# Patient Record
Sex: Male | Born: 1985 | Race: Black or African American | Hispanic: No | Marital: Single | Smoking: Never smoker
Health system: Southern US, Community
[De-identification: ages and names within clinical notes are randomized; demographics above are authoritative.]

## PROBLEM LIST (undated history)

## (undated) DIAGNOSIS — J45909 Unspecified asthma, uncomplicated: Secondary | ICD-10-CM

## (undated) HISTORY — PX: LUNG SURGERY: SHX703

---

## 2014-11-01 DIAGNOSIS — L298 Other pruritus: Secondary | ICD-10-CM | POA: Insufficient documentation

## 2017-10-06 ENCOUNTER — Other Ambulatory Visit (HOSPITAL_COMMUNITY)
Admission: RE | Admit: 2017-10-06 | Discharge: 2017-10-06 | Disposition: A | Discharge status: Home / Self Care | Payer: Managed Care, Other (non HMO) | Source: Ambulatory Visit | Attending: Family Medicine | Admitting: Family Medicine

## 2017-10-06 ENCOUNTER — Other Ambulatory Visit: Payer: Self-pay | Admitting: Family Medicine

## 2017-10-06 DIAGNOSIS — Z113 Encounter for screening for infections with a predominantly sexual mode of transmission: Secondary | ICD-10-CM | POA: Insufficient documentation

## 2017-10-13 LAB — URINE CYTOLOGY ANCILLARY ONLY
BACTERIAL VAGINITIS: NEGATIVE
CANDIDA VAGINITIS: POSITIVE — AB
Chlamydia: NEGATIVE
NEISSERIA GONORRHEA: NEGATIVE
Trichomonas: NEGATIVE

## 2017-10-28 ENCOUNTER — Other Ambulatory Visit: Payer: Self-pay

## 2017-10-28 ENCOUNTER — Ambulatory Visit (HOSPITAL_COMMUNITY)
Admission: EM | Admit: 2017-10-28 | Discharge: 2017-10-28 | Disposition: A | Discharge status: Home / Self Care | Payer: Managed Care, Other (non HMO) | Attending: Family Medicine | Admitting: Family Medicine

## 2017-10-28 ENCOUNTER — Encounter (HOSPITAL_COMMUNITY): Payer: Self-pay | Admitting: Emergency Medicine

## 2017-10-28 DIAGNOSIS — J069 Acute upper respiratory infection, unspecified: Secondary | ICD-10-CM

## 2017-10-28 DIAGNOSIS — H6122 Impacted cerumen, left ear: Secondary | ICD-10-CM | POA: Diagnosis not present

## 2017-10-28 HISTORY — DX: Unspecified asthma, uncomplicated: J45.909

## 2017-10-28 NOTE — ED Provider Notes (Signed)
[facility]    CSN: [id number] Arrival date & time: 10/28/17  1407     History   Chief Complaint Chief Complaint  Patient presents with  . URI    HPI Jonathon Atkinson is a 31 y.o. male.   31 year old male complaining of cough, occasional chills, sneezing, nasal congestion. Denies fever. All medicine Mucinex. Also states his left ear is clogged.      Past Medical History:  Diagnosis Date  . Asthma     There are no active problems to display for this patient.   History reviewed. No pertinent surgical history.     Home Medications    Prior to Admission medications   Not on File    Family History Family History  Problem Relation Age of Onset  . Heart failure Mother   . Diabetes Father     Social History Social History   Tobacco Use  . Smoking status: Never Smoker  Substance Use Topics  . Alcohol use: No    Frequency: Never  . Drug use: Not on file     Allergies   Sulfa antibiotics   Review of Systems Review of Systems  Constitutional: Negative.  Negative for diaphoresis and fatigue.  HENT: Positive for congestion and postnasal drip. Negative for ear pain, facial swelling, rhinorrhea, sore throat and trouble swallowing.   Eyes: Negative for pain, discharge and redness.  Respiratory: Positive for cough. Negative for chest tightness and shortness of breath.   Cardiovascular: Negative.   Gastrointestinal: Negative.   Musculoskeletal: Negative.  Negative for neck pain and neck stiffness.  Neurological: Negative.   All other systems reviewed and are negative.    Physical Exam Triage Vital Signs ED Triage Vitals [10/28/17 1532]  Enc Vitals Group     BP 121/66     Pulse Rate 87     Resp 16     Temp 98.5 F (36.9 C)     Temp src      SpO2 100 %     Weight      Height      Head Circumference      Peak Flow      Pain Score      Pain Loc      Pain Edu?      Excl. in GC?    No data found.  Updated Vital Signs BP 121/66    Pulse 87   Temp 98.5 F (36.9 C)   Resp 16   SpO2 100%   Visual Acuity Right Eye Distance:   Left Eye Distance:   Bilateral Distance:    Right Eye Near:   Left Eye Near:    Bilateral Near:     Physical Exam  Constitutional: He is oriented to person, place, and time. He appears well-developed and well-nourished. No distress.  HENT:  Right TM normal. Left TM obscured by cerumen. Oropharynx with minor erythema, cobblestoning and clear PND. No swelling or exudate.  Eyes: EOM are normal.  Neck: Normal range of motion. Neck supple.  Cardiovascular: Normal rate and regular rhythm.  Pulmonary/Chest: Effort normal and breath sounds normal. No respiratory distress.  Musculoskeletal: Normal range of motion. He exhibits no edema.  Lymphadenopathy:    He has no cervical adenopathy.  Neurological: He is alert and oriented to person, place, and time.  Skin: Skin is warm and dry. No rash noted.  Psychiatric: He has a normal mood and affect.  Nursing note and vitals reviewed.    UC Treatments /  Results  Labs (all labs ordered are listed, but only abnormal results are displayed) Labs Reviewed - No data to display  EKG  EKG Interpretation None       Radiology No results found.  Procedures Procedures (including critical care time)  Medications Ordered in UC Medications - No data to display   Initial Impression / Assessment and Plan / UC Course  I have reviewed the triage vital signs and the nursing notes.  Pertinent labs & imaging results that were available during my care of the patient were reviewed by me and considered in my medical decision making (see chart for details).    Sudafed PE 10 mg every 4 to 6 hours as needed for congestion Allegra or Zyrtec daily as needed for drainage and runny nose. For stronger antihistamine may take Chlor-Trimeton 2 to 4 mg every 4 to 6 hours, may cause drowsiness.S Saline nasal spray used frequently. Drink plenty of fluids and stay  well-hydrated.     Final Clinical Impressions(s) / UC Diagnoses   Final diagnoses:  Acute upper respiratory infection  Excessive cerumen in ear canal, left    ED Discharge Orders    None       Controlled Substance Prescriptions [place] Controlled Substance Registry consulted? Not Applicable   Hayden RasmussenMabe, Mazin Emma, NP 10/28/17 1616

## 2017-10-28 NOTE — ED Triage Notes (Signed)
Pt c/o cold symptoms coughing, nasal congestion x2 days. Ear clogged.

## 2017-10-28 NOTE — Discharge Instructions (Signed)
Sudafed PE 10 mg every 4 to 6 hours as needed for congestion °Allegra or Zyrtec daily as needed for drainage and runny nose. °For stronger antihistamine may take Chlor-Trimeton 2 to 4 mg every 4 to 6 hours, may cause drowsiness.S °Saline nasal spray used frequently. °Drink plenty of fluids and stay well-hydrated. °

## 2018-08-12 ENCOUNTER — Emergency Department: Payer: Self-pay

## 2018-08-12 ENCOUNTER — Other Ambulatory Visit: Payer: Self-pay

## 2018-08-12 ENCOUNTER — Encounter: Payer: Self-pay | Admitting: Emergency Medicine

## 2018-08-12 ENCOUNTER — Emergency Department
Admission: EM | Admit: 2018-08-12 | Discharge: 2018-08-12 | Disposition: A | Discharge status: Home / Self Care | Payer: Self-pay | Attending: Emergency Medicine | Admitting: Emergency Medicine

## 2018-08-12 DIAGNOSIS — J45909 Unspecified asthma, uncomplicated: Secondary | ICD-10-CM | POA: Insufficient documentation

## 2018-08-12 DIAGNOSIS — R0789 Other chest pain: Secondary | ICD-10-CM | POA: Insufficient documentation

## 2018-08-12 LAB — CBC
HEMATOCRIT: 45 % (ref 39.0–52.0)
HEMOGLOBIN: 15.7 g/dL (ref 13.0–17.0)
MCH: 30.3 pg (ref 26.0–34.0)
MCHC: 34.9 g/dL (ref 30.0–36.0)
MCV: 86.7 fL (ref 80.0–100.0)
Platelets: 214 10*3/uL (ref 150–400)
RBC: 5.19 MIL/uL (ref 4.22–5.81)
RDW: 12.3 % (ref 11.5–15.5)
WBC: 8.9 10*3/uL (ref 4.0–10.5)
nRBC: 0 % (ref 0.0–0.2)

## 2018-08-12 LAB — BASIC METABOLIC PANEL
Anion gap: 7 (ref 5–15)
BUN: 17 mg/dL (ref 6–20)
CHLORIDE: 104 mmol/L (ref 98–111)
CO2: 28 mmol/L (ref 22–32)
CREATININE: 0.93 mg/dL (ref 0.61–1.24)
Calcium: 8.9 mg/dL (ref 8.9–10.3)
GFR calc Af Amer: >60 mL/min (ref >60)
GFR calc non Af Amer: >60 mL/min (ref >60)
GLUCOSE: 112 mg/dL — AB (ref 70–99)
Potassium: 3.5 mmol/L (ref 3.5–5.1)
Sodium: 139 mmol/L (ref 135–145)

## 2018-08-12 LAB — TROPONIN I: Troponin I: <0.03 ng/mL (ref <0.03)

## 2018-08-12 NOTE — ED Notes (Signed)
Pt states chest pain is worse when lying down. Pt states was at work cleaning up a bathroom with ammonia. Pt states he inhaled some ammonia and began to have chest pain. Pt without resp distress.

## 2018-08-12 NOTE — ED Notes (Signed)
Report from angela, rn.  

## 2018-08-12 NOTE — Discharge Instructions (Signed)
As we discussed, we do believe you are having some discomfort in your lungs from inhaling the ammonia fumes, but it does not appear to be dangerous at this time.  Your x-rays, EKG, and labs are all normal.  Please drink plenty of fluids and use over-the-counter ibuprofen as needed; we recommend 600 mg by mouth 3 times a day with meals.  You can also use 2 extra strength aspirin (1000 mg) up to 4 times a day, but do not exceed this dose.  Follow-up with your regular doctor next week as needed.  Return to the emergency department if you develop new or worsening symptoms that concern you.

## 2018-08-12 NOTE — ED Triage Notes (Signed)
Patient states that he was cleaning with ammonia last night around 22:00. Patient states that he thinks that he inhaled to much of the ammonia. Patient states that he started having central chest pain around 23:00 and that the pain is worse when laying down. Patient denies shortness of breath, nausea or vomiting.

## 2018-08-12 NOTE — ED Provider Notes (Signed)
[facility] Emergency Department Provider Note  ____________________________________________   First MD Initiated Contact with Patient 08/12/18 [phone]     (approximate)  I have reviewed the triage vital signs and the nursing notes.   HISTORY  Chief Complaint Chest Pain    HPI Jonathon Atkinson is a 32 y.o. male with no medical history except for childhood asthma for which she uses no medicines currently.  He presents by private vehicle for evaluation of generalized chest discomfort.  He states that it started after he inhaled some ammonia fumes at work [employer].  The ammonia was not mixed with anything else, only water, but he thinks he might of used too much.  After stress started hurting went home but he found it difficult to sleep because of the discomfort which seem to be worse when he was lying down.  It is better now.  He denies shortness of breath.  He has a mild cough and some discomfort in his throat which is causing him to clear his throat frequently.  He denies headache, nausea, vomiting, shortness of breath, and abdominal pain.  He has no breathing treatments or other medications at home.  He has no history of diabetes, high blood pressure, hyperlipidemia, and no tobacco use history.  His symptoms are improving and nothing in particular made them better.  Past Medical History:  Diagnosis Date  . Asthma     There are no active problems to display for this patient.   Past Surgical History:  Procedure Laterality Date  . LUNG SURGERY      Prior to Admission medications   Not on File    Allergies Sulfa antibiotics  Family History  Problem Relation Age of Onset  . Heart failure Mother   . Diabetes Father     Social History Social History   Tobacco Use  . Smoking status: Never Smoker  . Smokeless tobacco: Never Used  Substance Use Topics  . Alcohol use: No    Frequency: Never  . Drug use: Never    Review of Systems Constitutional: No  fever/chills Eyes: No visual changes. ENT: No sore throat.  Increased secretions. Cardiovascular: Generalized chest discomfort, worse when lying flat, as described above Respiratory: Denies shortness of breath. Gastrointestinal: No abdominal pain.  No nausea, no vomiting.  No diarrhea.  No constipation. Genitourinary: Negative for dysuria. Musculoskeletal: Negative for neck pain.  Negative for back pain. Integumentary: Negative for rash. Neurological: Negative for headaches, focal weakness or numbness.   ____________________________________________   PHYSICAL EXAM:  VITAL SIGNS: ED Triage Vitals  Enc Vitals Group     BP 08/12/18 0551 125/66     Pulse Rate 08/12/18 0551 67     Resp 08/12/18 0551 18     Temp 08/12/18 0551 98.1 F (36.7 C)     Temp Source 08/12/18 0551 Oral     SpO2 08/12/18 0551 93 %     Weight 08/12/18 0552 81.6 kg (180 lb)     Height 08/12/18 0552 1.753 m (5\' 9" )     Head Circumference --      Peak Flow --      Pain Score 08/12/18 0551 6     Pain Loc --      Pain Edu? --      Excl. in GC? --     Constitutional: Alert and oriented. Well appearing and in no acute distress. Eyes: Conjunctivae are normal.  Head: Atraumatic. Nose: No congestion/rhinnorhea. Mouth/Throat: Mucous membranes are moist. Neck:  No stridor.  No meningeal signs.   Cardiovascular: Normal rate, regular rhythm. Good peripheral circulation. Grossly normal heart sounds.  The patient does not have any reproducible chest pain or chest tenderness when leaning forward or lying flat at the time of my evaluation. Respiratory: Normal respiratory effort.  No retractions. Lungs CTAB. Gastrointestinal: Soft and nontender. No distention.  Musculoskeletal: No lower extremity tenderness nor edema. No gross deformities of extremities. Neurologic:  Normal speech and language. No gross focal neurologic deficits are appreciated.  Skin:  Skin is warm, dry and intact. No rash noted. Psychiatric: Mood and  affect are normal. Speech and behavior are normal.  ____________________________________________   LABS (all labs ordered are listed, but only abnormal results are displayed)  Labs Reviewed  BASIC METABOLIC PANEL - Abnormal; Notable for the following components:      Result Value   Glucose, Bld 112 (*)    All other components within normal limits  CBC  TROPONIN I   ____________________________________________  EKG  ED ECG REPORT I, Loleta Rose, the attending physician, personally viewed and interpreted this ECG.  Date: 08/12/2018 EKG Time: 5:55 Rate: 72 Rhythm: normal sinus rhythm QRS Axis: normal Intervals: normal ST/T Wave abnormalities: early repolarization, doubt pericarditis and ACS Narrative Interpretation: no evidence of acute ischemia   ____________________________________________  RADIOLOGY I, Loleta Rose, personally viewed and evaluated these images (plain radiographs) as part of my medical decision making, as well as reviewing the written report by the radiologist.  ED MD interpretation: No indication of any acute abnormalities on chest x-ray  Official radiology report(s): Dg Chest 2 View  Result Date: 08/12/2018 CLINICAL DATA:  Patient inhaled ammonia while cleaning. Central chest pain. EXAM: CHEST - 2 VIEW COMPARISON:  None. FINDINGS: Heart size and pulmonary vascularity are normal. Streaky perihilar opacities and peribronchial thickening may indicate reactive airways disease. No focal consolidation. No blunting of costophrenic angles. No pneumothorax. IMPRESSION: Peribronchial changes suggesting reactive airways disease. No focal consolidation. Electronically Signed   By: Burman Nieves M.D.   On: 08/12/2018 06:58    ____________________________________________   PROCEDURES  Critical Care performed: No   Procedure(s) performed:   Procedures   ____________________________________________   INITIAL IMPRESSION / ASSESSMENT AND PLAN / ED  COURSE  As part of my medical decision making, I reviewed the following data within the electronic MEDICAL RECORD NUMBER Nursing notes reviewed and incorporated, Labs reviewed , EKG interpreted , Old chart reviewed and Radiograph reviewed     Differential diagnosis includes, but is not limited to, chemical pneumonitis, pericarditis/myocarditis, pneumothorax, musculoskeletal strain, much less likely ACS or PE.  The patient is awake, alert, and in no acute distress, speaking clearly and comfortably in full sentences.  He has no positional chest pain or discomfort at this time such as I might expect with a pericarditis presentation.   His symptoms began acutely after inhaling the fumes but he is having no respiratory distress and his symptoms have improved rapidly over the last few hours.  His EKG, chest x-ray, and labs are all within normal limits.  There is no indication for further intervention at this time.  I encourage the use of ibuprofen and Tylenol as needed and outpatient follow-up.  I gave my usual and customary return precautions.     ____________________________________________  FINAL CLINICAL IMPRESSION(S) / ED DIAGNOSES  Final diagnoses:  Atypical chest pain     MEDICATIONS GIVEN DURING THIS VISIT:  Medications - No data to display   ED Discharge Orders  None       Note:  This document was prepared using Dragon voice recognition software and may include unintentional dictation errors.    Loleta Rose, MD 08/12/18 [phone]

## 2018-11-01 ENCOUNTER — Ambulatory Visit (INDEPENDENT_AMBULATORY_CARE_PROVIDER_SITE_OTHER): Payer: BC Managed Care – PPO | Admitting: Internal Medicine

## 2018-11-01 ENCOUNTER — Encounter: Payer: Self-pay | Admitting: Internal Medicine

## 2018-11-01 VITALS — BP 110/70 | HR 55 | Temp 97.6°F | Ht 67.0 in | Wt 185.4 lb

## 2018-11-01 DIAGNOSIS — R21 Rash and other nonspecific skin eruption: Secondary | ICD-10-CM

## 2018-11-01 DIAGNOSIS — H6123 Impacted cerumen, bilateral: Secondary | ICD-10-CM | POA: Diagnosis not present

## 2018-11-01 DIAGNOSIS — Z0000 Encounter for general adult medical examination without abnormal findings: Secondary | ICD-10-CM

## 2018-11-01 DIAGNOSIS — Z113 Encounter for screening for infections with a predominantly sexual mode of transmission: Secondary | ICD-10-CM | POA: Diagnosis not present

## 2018-11-01 DIAGNOSIS — L298 Other pruritus: Secondary | ICD-10-CM

## 2018-11-01 LAB — POCT URINALYSIS DIPSTICK
Bilirubin, UA: NEGATIVE
Blood, UA: NEGATIVE
Glucose, UA: NEGATIVE
KETONES UA: NEGATIVE
LEUKOCYTES UA: NEGATIVE
Nitrite, UA: NEGATIVE
PH UA: 6.5 (ref 5.0–8.0)
Protein, UA: NEGATIVE
Spec Grav, UA: 1.015 (ref 1.010–1.025)
UROBILINOGEN UA: 0.2 U/dL

## 2018-11-01 MED ORDER — MUPIROCIN 2 % EX OINT: 1.0000 "application " | TOPICAL_OINTMENT | Freq: Two times a day (BID) | CUTANEOUS | 0 refills | Status: AC

## 2018-11-01 NOTE — Progress Notes (Signed)
Subjective:     Patient ID: Jonathon Atkinson , male    DOB: 1986/10/02 , 33 y.o.   MRN: [id number]   Chief Complaint  Patient presents with  . New Patient (Initial Visit)    patient would like to do BW today   . Cough    coughing for the past 3 weeks     HPI  Pt is here to establish care with us. Used to exercise  Up to 2 years ago. Has grown out his asthma and has not had to use his inhaler in a long time.   Wants STD screen. Always uses condoms. Never had STD's in the past.  2- chronic rash on posterior scalp 3- L border nose sore x 1 week.  4- has hx of chronic wax, no trouble hearing.   Past Medical History:  Diagnosis Date  . Asthma      Family History  Problem Relation Age of Onset  . Heart failure Mother   . Diabetes Father     No current outpatient medications on file.   Allergies  Allergen Reactions  . Sulfa Antibiotics     Review of Systems  Constitutional: Negative for appetite change, chills, diaphoresis, fever and unexpected weight change.  HENT: Negative.   Eyes: Negative for discharge.  Respiratory: Negative for cough and chest tightness.        On occasion gets a cough from URI 3 weeks ago   Gastrointestinal: Negative.   Endocrine: Negative for polydipsia, polyphagia and polyuria.  Genitourinary: Negative for discharge, dysuria, genital sores, penile swelling and scrotal swelling.  Musculoskeletal: Negative.   Skin: Negative for rash.  Neurological: Negative for dizziness and headaches.    Today's Vitals   11/01/18 1039  BP: 110/70  Pulse: (!) 55  Temp: 97.6 F (36.4 C)  TempSrc: Oral  SpO2: 96%  Weight: 185 lb 6.4 oz (84.1 kg)  Height: 5\' 7"  (1.702 m)   Body mass index is 29.04 kg/m.   Objective:  Physical Exam   Constitutional: He is oriented to person, place, and time. He appears well-developed and well-nourished. No distress.  HENT: both TM's not visible due to wax Head: Normocephalic and atraumatic.  Right Ear: External ear  normal.  Left Ear: External ear normal.  Nose: Nose normal.  Eyes: Conjunctivae are normal. Right eye exhibits no discharge. Left eye exhibits no discharge. No scleral icterus.  Neck: Neck supple. No thyromegaly present.  No carotid bruits bilaterally  Cardiovascular: Normal rate and regular rhythm.  No murmur heard. Pulmonary/Chest: Effort normal and breath sounds normal. No respiratory distress.  Musculoskeletal: Normal range of motion. He exhibits no edema.  Lymphadenopathy:    She has no cervical adenopathy.  Neurological: She is alert and oriented to person, place, and time.  Skin: Skin is warm and dry. Capillary refill takes less than 2 seconds. Hard skin papules on occipital hair lind border noted.  He is not diaphoretic.  Psychiatric: He has a normal mood and affect. Her behavior is normal. Judgment and thought content normal.  Nursing note reviewed.  Assessment And Plan:    1. Screen for STD (sexually transmitted disease)- screen - HIV antibody (with reflex) - Herpes Simplex Virus 1/2 Antibody (IgM), IFA with Reflex to Titer, CSF - Urine cytology ancillary only - RPR  2. Rash- chronic - Ambulatory referral to Dermatology  3. Excessive cerumen in both ear canals- acute. Was advised to use debrox qhs until he comes back and we can do a wash  the day he gets his physical.     Ionna Avis RODRIGUEZ-SOUTHWORTH, PA-C

## 2018-11-01 NOTE — Addendum Note (Signed)
Addended by: Radene Knee on: 11/01/2018 04:19 PM   Modules accepted: Orders

## 2018-11-01 NOTE — Patient Instructions (Addendum)
Use Debrox drops to soften the was, use it every night til your next visit.    Earwax Buildup, Adult The ears produce a substance called earwax that helps keep bacteria out of the ear and protects the skin in the ear canal. Occasionally, earwax can build up in the ear and cause discomfort or hearing loss. What increases the risk? This condition is more likely to develop in people who:  Are male.  Are elderly.  Naturally produce more earwax.  Clean their ears often with cotton swabs.  Use earplugs often.  Use in-ear headphones often.  Wear hearing aids.  Have narrow ear canals.  Have earwax that is overly thick or sticky.  Have eczema.  Are dehydrated.  Have excess hair in the ear canal. What are the signs or symptoms? Symptoms of this condition include:  Reduced or muffled hearing.  A feeling of fullness in the ear or feeling that the ear is plugged.  Fluid coming from the ear.  Ear pain.  Ear itch.  Ringing in the ear.  Coughing.  An obvious piece of earwax that can be seen inside the ear canal. How is this diagnosed? This condition may be diagnosed based on:  Your symptoms.  Your medical history.  An ear exam. During the exam, your health care provider will look into your ear with an instrument called an otoscope. You may have tests, including a hearing test. How is this treated? This condition may be treated by:  Using ear drops to soften the earwax.  Having the earwax removed by a health care provider. The health care provider may: ? Flush the ear with water. ? Use an instrument that has a loop on the end (curette). ? Use a suction device.  Surgery to remove the wax buildup. This may be done in severe cases. Follow these instructions at home:   Take over-the-counter and prescription medicines only as told by your health care provider.  Do not put any objects, including cotton swabs, into your ear. You can clean the opening of your ear  canal with a washcloth or facial tissue.  Follow instructions from your health care provider about cleaning your ears. Do not over-clean your ears.  Drink enough fluid to keep your urine clear or pale yellow. This will help to thin the earwax.  Keep all follow-up visits as told by your health care provider. If earwax builds up in your ears often or if you use hearing aids, consider seeing your health care provider for routine, preventive ear cleanings. Ask your health care provider how often you should schedule your cleanings.  If you have hearing aids, clean them according to instructions from the manufacturer and your health care provider. Contact a health care provider if:  You have ear pain.  You develop a fever.  You have blood, pus, or other fluid coming from your ear.  You have hearing loss.  You have ringing in your ears that does not go away.  Your symptoms do not improve with treatment.  You feel like the room is spinning (vertigo). Summary  Earwax can build up in the ear and cause discomfort or hearing loss.  The most common symptoms of this condition include reduced or muffled hearing and a feeling of fullness in the ear or feeling that the ear is plugged.  This condition may be diagnosed based on your symptoms, your medical history, and an ear exam.  This condition may be treated by using ear drops to soften  the earwax or by having the earwax removed by a health care provider.  Do not put any objects, including cotton swabs, into your ear. You can clean the opening of your ear canal with a washcloth or facial tissue. This information is not intended to replace advice given to you by your health care provider. Make sure you discuss any questions you have with your health care provider. Document Released: 11/24/2004 Document Revised: 09/28/2017 Document Reviewed: 12/28/2016 Elsevier Interactive Patient Education  2019 [employer]

## 2018-11-02 LAB — HSV(HERPES SIMPLEX VRS) I + II AB-IGG: HSV 1 Glycoprotein G Ab, IgG: 20.8 index — ABNORMAL HIGH (ref 0.00–0.90)

## 2018-11-02 LAB — HIV ANTIBODY (ROUTINE TESTING W REFLEX): HIV Screen 4th Generation wRfx: NONREACTIVE

## 2018-11-02 LAB — RPR: RPR: NONREACTIVE

## 2018-11-04 LAB — CHLAMYDIA/GONOCOCCUS/TRICHOMONAS, NAA
Chlamydia by NAA: NEGATIVE
Gonococcus by NAA: NEGATIVE
Trich vag by NAA: NEGATIVE

## 2018-11-15 ENCOUNTER — Other Ambulatory Visit: Payer: Self-pay

## 2018-11-15 ENCOUNTER — Ambulatory Visit (INDEPENDENT_AMBULATORY_CARE_PROVIDER_SITE_OTHER): Payer: BC Managed Care – PPO | Admitting: Internal Medicine

## 2018-11-15 ENCOUNTER — Ambulatory Visit: Payer: BC Managed Care – PPO | Admitting: Internal Medicine

## 2018-11-15 ENCOUNTER — Encounter: Payer: Self-pay | Admitting: Internal Medicine

## 2018-11-15 VITALS — BP 116/62 | HR 60 | Temp 98.5°F | Ht 68.6 in | Wt 183.4 lb

## 2018-11-15 DIAGNOSIS — Z23 Encounter for immunization: Secondary | ICD-10-CM

## 2018-11-15 DIAGNOSIS — J069 Acute upper respiratory infection, unspecified: Secondary | ICD-10-CM

## 2018-11-15 DIAGNOSIS — M2142 Flat foot [pes planus] (acquired), left foot: Secondary | ICD-10-CM | POA: Diagnosis not present

## 2018-11-15 DIAGNOSIS — J4520 Mild intermittent asthma, uncomplicated: Secondary | ICD-10-CM | POA: Insufficient documentation

## 2018-11-15 DIAGNOSIS — Z0000 Encounter for general adult medical examination without abnormal findings: Secondary | ICD-10-CM | POA: Diagnosis not present

## 2018-11-15 DIAGNOSIS — M2141 Flat foot [pes planus] (acquired), right foot: Secondary | ICD-10-CM | POA: Diagnosis not present

## 2018-11-15 DIAGNOSIS — E663 Overweight: Secondary | ICD-10-CM | POA: Diagnosis not present

## 2018-11-15 DIAGNOSIS — Z8673 Personal history of transient ischemic attack (TIA), and cerebral infarction without residual deficits: Secondary | ICD-10-CM | POA: Insufficient documentation

## 2018-11-15 DIAGNOSIS — H6123 Impacted cerumen, bilateral: Secondary | ICD-10-CM

## 2018-11-15 DIAGNOSIS — Z833 Family history of diabetes mellitus: Secondary | ICD-10-CM

## 2018-11-15 LAB — POCT URINALYSIS DIPSTICK
Bilirubin, UA: NEGATIVE
Glucose, UA: NEGATIVE
KETONES UA: NEGATIVE
Leukocytes, UA: NEGATIVE
NITRITE UA: NEGATIVE
PH UA: 6 (ref 5.0–8.0)
PROTEIN UA: NEGATIVE
RBC UA: NEGATIVE
Spec Grav, UA: 1.02 (ref 1.010–1.025)
UROBILINOGEN UA: 0.2 U/dL

## 2018-11-15 NOTE — Patient Instructions (Addendum)
Normal BMI is 25, you are at 27.4  Your next Pneumonia 23 vaccine is due in 10 years.  Use Flonase 2 sprays each nostril ones a day as needed for stuffy nose and post nasal drainage.  Come back if your cough gets worse, you are wheezing and have a fever.   Upper Respiratory Infection, Adult An upper respiratory infection (URI) affects the nose, throat, and upper air passages. URIs are caused by germs (viruses). The most common type of URI is often called "the common cold." Medicines cannot cure URIs, but you can do things at home to relieve your symptoms. URIs usually get better within 7-10 days. Follow these instructions at home: Activity  Rest as needed.  If you have a fever, stay home from work or school until your fever is gone, or until your doctor says you may return to work or school. ? You should stay home until you cannot spread the infection anymore (you are not contagious). ? Your doctor may have you wear a face mask so you have less risk of spreading the infection. Relieving symptoms  Gargle with a salt-water mixture 3-4 times a day or as needed. To make a salt-water mixture, completely dissolve -1 tsp of salt in 1 cup of warm water.  Use a cool-mist humidifier to add moisture to the air. This can help you breathe more easily. Eating and drinking   Drink enough fluid to keep your pee (urine) pale yellow.  Eat soups and other clear broths. General instructions   Take over-the-counter and prescription medicines only as told by your doctor. These include cold medicines, fever reducers, and cough suppressants.  Do not use any products that contain nicotine or tobacco. These include cigarettes and e-cigarettes. If you need help quitting, ask your doctor.  Avoid being where people are smoking (avoid secondhand smoke).  Make sure you get regular shots and get the flu shot every year.  Keep all follow-up visits as told by your doctor. This is important. How to avoid  spreading infection to others   Wash your hands often with soap and water. If you do not have soap and water, use hand sanitizer.  Avoid touching your mouth, face, eyes, or nose.  Cough or sneeze into a tissue or your sleeve or elbow. Do not cough or sneeze into your hand or into the air. Contact a doctor if:  You are getting worse, not better.  You have any of these: ? A fever. ? Chills. ? Brown or red mucus in your nose. ? Yellow or brown fluid (discharge)coming from your nose. ? Pain in your face, especially when you bend forward. ? Swollen neck glands. ? Pain with swallowing. ? White areas in the back of your throat. Get help right away if:  You have shortness of breath that gets worse.  You have very bad or constant: ? Headache. ? Ear pain. ? Pain in your forehead, behind your eyes, and over your cheekbones (sinus pain). ? Chest pain.  You have long-lasting (chronic) lung disease along with any of these: ? Wheezing. ? Long-lasting cough. ? Coughing up blood. ? A change in your usual mucus.  You have a stiff neck.  You have changes in your: ? Vision. ? Hearing. ? Thinking. ? Mood. Summary  An upper respiratory infection (URI) is caused by a germ called a virus. The most common type of URI is often called "the common cold."  URIs usually get better within 7-10 days.  Take over-the-counter  and prescription medicines only as told by your doctor. This information is not intended to replace advice given to you by your health care provider. Make sure you discuss any questions you have with your health care provider. Document Released: 04/04/2008 Document Revised: 06/09/2017 Document Reviewed: 06/09/2017 Elsevier Interactive Patient Education  2019 [employer] Calories are units of energy. Your body needs a certain amount of calories from food to keep you going throughout the day. When you eat more calories than your body  needs, your body stores the extra calories as fat. When you eat fewer calories than your body needs, your body burns fat to get the energy it needs. Calorie counting means keeping track of how many calories you eat and drink each day. Calorie counting can be helpful if you need to lose weight. If you make sure to eat fewer calories than your body needs, you should lose weight. Ask your health care provider what a healthy weight is for you. For calorie counting to work, you will need to eat the right number of calories in a day in order to lose a healthy amount of weight per week. A dietitian can help you determine how many calories you need in a day and will give you suggestions on how to reach your calorie goal.  A healthy amount of weight to lose per week is usually 1-2 lb (0.5-0.9 kg). This usually means that your daily calorie intake should be reduced by 500-750 calories.  Eating 1,200 - 1,500 calories per day can help most women lose weight.  Eating 1,500 - 1,800 calories per day can help most men lose weight. What is my plan? My goal is to have __________ calories per day. If I have this many calories per day, I should lose around __________ pounds per week. What do I need to know about calorie counting? In order to meet your daily calorie goal, you will need to:  Find out how many calories are in each food you would like to eat. Try to do this before you eat.  Decide how much of the food you plan to eat.  Write down what you ate and how many calories it had. Doing this is called keeping a food log. To successfully lose weight, it is important to balance calorie counting with a healthy lifestyle that includes regular activity. Aim for 150 minutes of moderate exercise (such as walking) or 75 minutes of vigorous exercise (such as running) each week. Where do I find calorie information?  The number of calories in a food can be found on a Nutrition Facts label. If a food does not have a  Nutrition Facts label, try to look up the calories online or ask your dietitian for help. Remember that calories are listed per serving. If you choose to have more than one serving of a food, you will have to multiply the calories per serving by the amount of servings you plan to eat. For example, the label on a package of bread might say that a serving size is 1 slice and that there are 90 calories in a serving. If you eat 1 slice, you will have eaten 90 calories. If you eat 2 slices, you will have eaten 180 calories. How do I keep a food log? Immediately after each meal, record the following information in your food log:  What you ate. Don't forget to include toppings, sauces, and other extras on the food.  How much you ate. This can be measured in cups, ounces, or number of items.  How many calories each food and drink had.  The total number of calories in the meal. Keep your food log near you, such as in a small notebook in your pocket, or use a mobile app or website. Some programs will calculate calories for you and show you how many calories you have left for the day to meet your goal. What are some calorie counting tips?   Use your calories on foods and drinks that will fill you up and not leave you hungry: ? Some examples of foods that fill you up are nuts and nut butters, vegetables, lean proteins, and high-fiber foods like whole grains. High-fiber foods are foods with more than 5 g fiber per serving. ? Drinks such as sodas, specialty coffee drinks, alcohol, and juices have a lot of calories, yet do not fill you up.  Eat nutritious foods and avoid empty calories. Empty calories are calories you get from foods or beverages that do not have many vitamins or protein, such as candy, sweets, and soda. It is better to have a nutritious high-calorie food (such as an avocado) than a food with few nutrients (such as a bag of chips).  Know how many calories are in the foods you eat most often.  This will help you calculate calorie counts faster.  Pay attention to calories in drinks. Low-calorie drinks include water and unsweetened drinks.  Pay attention to nutrition labels for "low fat" or "fat free" foods. These foods sometimes have the same amount of calories or more calories than the full fat versions. They also often have added sugar, starch, or salt, to make up for flavor that was removed with the fat.  Find a way of tracking calories that works for you. Get creative. Try different apps or programs if writing down calories does not work for you. What are some portion control tips?  Know how many calories are in a serving. This will help you know how many servings of a certain food you can have.  Use a measuring cup to measure serving sizes. You could also try weighing out portions on a kitchen scale. With time, you will be able to estimate serving sizes for some foods.  Take some time to put servings of different foods on your favorite plates, bowls, and cups so you know what a serving looks like.  Try not to eat straight from a bag or box. Doing this can lead to overeating. Put the amount you would like to eat in a cup or on a plate to make sure you are eating the right portion.  Use smaller plates, glasses, and bowls to prevent overeating.  Try not to multitask (for example, watch TV or use your computer) while eating. If it is time to eat, sit down at a table and enjoy your food. This will help you to know when you are full. It will also help you to be aware of what you are eating and how much you are eating. What are tips for following this plan? Reading food labels  Check the calorie count compared to the serving size. The serving size may be smaller than what you are used to eating.  Check the source of the calories. Make sure the food you are eating is high in vitamins and protein and low in saturated and trans fats. Shopping  Read nutrition labels while you shop.  This will help  you make healthy decisions before you decide to purchase your food.  Make a grocery list and stick to it. Cooking  Try to cook your favorite foods in a healthier way. For example, try baking instead of frying.  Use low-fat dairy products. Meal planning  Use more fruits and vegetables. Half of your plate should be fruits and vegetables.  Include lean proteins like poultry and fish. How do I count calories when eating out?  Ask for smaller portion sizes.  Consider sharing an entree and sides instead of getting your own entree.  If you get your own entree, eat only half. Ask for a box at the beginning of your meal and put the rest of your entree in it so you are not tempted to eat it.  If calories are listed on the menu, choose the lower calorie options.  Choose dishes that include vegetables, fruits, whole grains, low-fat dairy products, and lean protein.  Choose items that are boiled, broiled, grilled, or steamed. Stay away from items that are buttered, battered, fried, or served with cream sauce. Items labeled "crispy" are usually fried, unless stated otherwise.  Choose water, low-fat milk, unsweetened iced tea, or other drinks without added sugar. If you want an alcoholic beverage, choose a lower calorie option such as a glass of wine or light beer.  Ask for dressings, sauces, and syrups on the side. These are usually high in calories, so you should limit the amount you eat.  If you want a salad, choose a garden salad and ask for grilled meats. Avoid extra toppings like bacon, cheese, or fried items. Ask for the dressing on the side, or ask for olive oil and vinegar or lemon to use as dressing.  Estimate how many servings of a food you are given. For example, a serving of cooked rice is  cup or about the size of half a baseball. Knowing serving sizes will help you be aware of how much food you are eating at restaurants. The list below tells you how big or small some  common portion sizes are based on everyday objects: ? 1 oz-4 stacked dice. ? 3 oz-1 deck of cards. ? 1 tsp-1 die. ? 1 Tbsp- a ping-pong ball. ? 2 Tbsp-1 ping-pong ball. ?  cup- baseball. ? 1 cup-1 baseball. Summary  Calorie counting means keeping track of how many calories you eat and drink each day. If you eat fewer calories than your body needs, you should lose weight.  A healthy amount of weight to lose per week is usually 1-2 lb (0.5-0.9 kg). This usually means reducing your daily calorie intake by 500-750 calories.  The number of calories in a food can be found on a Nutrition Facts label. If a food does not have a Nutrition Facts label, try to look up the calories online or ask your dietitian for help.  Use your calories on foods and drinks that will fill you up, and not on foods and drinks that will leave you hungry.  Use smaller plates, glasses, and bowls to prevent overeating. This information is not intended to replace advice given to you by your health care provider. Make sure you discuss any questions you have with your health care provider. Document Released: 10/17/2005 Document Revised: 07/06/2018 Document Reviewed: 09/16/2016 Elsevier Interactive Patient Education  2019 [employer] 18-39 Years, Male Preventive care refers to lifestyle choices and visits with your health care provider that can promote health and wellness. What does preventive care include?  A yearly physical exam. This is also called an annual well check.  Dental exams once or twice a year.  Routine eye exams. Ask your health care provider how often you should have your eyes checked.  Personal lifestyle choices, including: ? Daily care of your teeth and gums. ? Regular physical activity. ? Eating a healthy diet. ? Avoiding tobacco and drug use. ? Limiting alcohol use. ? Practicing safe sex. What happens during an annual well check? The services and screenings done by your  health care provider during your annual well check will depend on your age, overall health, lifestyle risk factors, and family history of disease. Counseling Your health care provider may ask you questions about your:  Alcohol use.  Tobacco use.  Drug use.  Emotional well-being.  Home and relationship well-being.  Sexual activity.  Eating habits.  Work and work Statistician. Screening You may have the following tests or measurements:  Height, weight, and BMI.  Blood pressure.  Lipid and cholesterol levels. These may be checked every 5 years starting at age 66.  Diabetes screening. This is done by checking your blood sugar (glucose) after you have not eaten for a while (fasting).  Skin check.  Hepatitis C blood test.  Hepatitis B blood test.  Sexually transmitted disease (STD) testing. Discuss your test results, treatment options, and if necessary, the need for more tests with your health care provider. Vaccines Your health care provider may recommend certain vaccines, such as:  Influenza vaccine. This is recommended every year.  Tetanus, diphtheria, and acellular pertussis (Tdap, Td) vaccine. You may need a Td booster every 10 years.  Varicella vaccine. You may need this if you have not been vaccinated.  HPV vaccine. If you are 20 or younger, you may need three doses over 6 months.  Measles, mumps, and rubella (MMR) vaccine. You may need at least one dose of MMR.You may also need a second dose.  Pneumococcal 13-valent conjugate (PCV13) vaccine. You may need this if you have certain conditions and have not been vaccinated.  Pneumococcal polysaccharide (PPSV23) vaccine. You may need one or two doses if you smoke cigarettes or if you have certain conditions.  Meningococcal vaccine. One dose is recommended if you are age 17-21 years and a first-year college student living in a residence hall, or if you have one of several medical conditions. You may also need  additional booster doses.  Hepatitis A vaccine. You may need this if you have certain conditions or if you travel or work in places where you may be exposed to hepatitis A.  Hepatitis B vaccine. You may need this if you have certain conditions or if you travel or work in places where you may be exposed to hepatitis B.  Haemophilus influenzae type b (Hib) vaccine. You may need this if you have certain risk factors. Talk to your health care provider about which screenings and vaccines you need and how often you need them. This information is not intended to replace advice given to you by your health care provider. Make sure you discuss any questions you have with your health care provider. Document Released: 12/13/2001 Document Revised: 05/30/2017 Document Reviewed: 08/18/2015 Elsevier Interactive Patient Education  2019 [employer].

## 2018-11-15 NOTE — Progress Notes (Addendum)
* Subjective:     Patient ID: Jonathon Atkinson , male    DOB: 1986/09/30 , 33 y.o.   MRN: [id number]   Chief Complaint  Patient presents with  . Annual Exam    HPI  Pt is here for yearly physical. He also needs to get his ears lavaged due to cerumen impaction. Also has slight cold. He does not recall having a pneumonia shot, and cant recall when his last TD was. He did get one Hep B series in November, but could not go back to get the rest. Would like to re-start them  Past Medical History:  Diagnosis Date  . Asthma      Family History  Problem Relation Age of Onset  . Heart failure Mother   . Diabetes Father     No current outpatient medications on file.   Allergies  Allergen Reactions  . Sulfa Antibiotics      Review of Systems  HENT: Positive for postnasal drip.   Respiratory: Positive for cough. Negative for wheezing.   Skin: Positive for rash.       On occipital scalp for which he was referred to dermatology last visit.    The rest is negative  Today's Vitals   11/15/18 0959  BP: 116/62  Pulse: 60  Temp: 98.5 F (36.9 C)  TempSrc: Oral  SpO2: 97%  Weight: 183 lb 6.4 oz (83.2 kg)  Height: 5' 8.6" (1.742 m)   Body mass index is 27.4 kg/m.   Objective:  Physical Exam  BP 116/62 (BP Location: Left Arm, Patient Position: Sitting, Cuff Size: Normal)   Pulse 60   Temp 98.5 F (36.9 C) (Oral)   Ht 5' 8.6" (1.742 m)   Wt 183 lb 6.4 oz (83.2 kg)   SpO2 97%   BMI 27.40 kg/m   General Appearance:    Alert, cooperative, no distress, appears stated age  Head:    Normocephalic, without obvious abnormality, atraumatic  Eyes:    PERRL, conjunctiva/corneas clear, EOM's intact, fundi    benign, both eyes       Ears:    Has cerumen in both ears, R>L. After lavage TM's were normal.   Nose:   Nares normal, septum midline, mucosa normal, no drainage   or sinus tenderness  Throat:   Lips, mucosa, and tongue normal; teeth and gums normal  Neck:   Supple, symmetrical,  trachea midline, no adenopathy;       thyroid:  No enlargement/tenderness/nodules; no carotid   bruit or JVD  Back:     Symmetric, no curvature, ROM normal, no CVA tenderness  Lungs:     Clear to auscultation bilaterally, respirations unlabored  Chest wall:    No tenderness or deformity  Heart:    Regular rate and rhythm, S1 and S2 normal, no murmur, rub   or gallop  Abdomen:     Soft, non-tender, bowel sounds active all four quadrants,    no masses, no organomegaly  Genitalia:    Normal male without lesion, discharge or tenderness     Extremities:   Extremities normal, atraumatic, no cyanosis or edema. Has bilateral flat feet.   Pulses:   2+ and symmetric all extremities  Skin:   Skin color, texture, turgor normal, Hard skin papules on occipital hair lind border noted.  Lymph nodes:   Cervical, supraclavicular, and axillary nodes normal  Neurologic:   CNII-XII intact. Normal strength, sensation and reflexes      Throughout. Has normal Rhomberg, tandem  gait, tip toe and heel gait, finger to nose.       Assessment And Plan:   1. Mild intermittent asthma without complication-stable  2. Routine medical exam- routine. FU 1y - CBC no Diff - CMP14 + Anion Gap - Lipid Profile - TSH - T3, free - T4, Free  3. Flat feet, bilateral- chronic. No action  4. Overweight (BMI 25.0-29.9)- he is already exercising and will continue this.   5. Excessive cerumen in both ear canals- lavage done bilaterally.   6. Family history of diabetes mellitus type II- screen - Hemoglobin A1c 7- URI- may continue using his inhaler prn. May get Flonase for stuffy nose and post nasal drainage. Needs to return if he gets a fever.   8- Immunization due- TDAP and pneumonia immunization was given.  FU in 6 months, asthma and ear wax FU. Was asked to bring his immunization records.  Was advised to go to the health department to get Hep B series.  We will inform him of his results when they are back.   Taquilla Downum  RODRIGUEZ-SOUTHWORTH, PA-C

## 2018-11-16 LAB — CMP14 + ANION GAP
ALK PHOS: 74 IU/L (ref 39–117)
ALT: 41 IU/L (ref 0–44)
AST: 23 IU/L (ref 0–40)
Albumin/Globulin Ratio: 1.4 (ref 1.2–2.2)
Albumin: 4.6 g/dL (ref 3.5–5.5)
Anion Gap: 17 mmol/L (ref 10.0–18.0)
BILIRUBIN TOTAL: 0.6 mg/dL (ref 0.0–1.2)
BUN/Creatinine Ratio: 11 (ref 9–20)
BUN: 10 mg/dL (ref 6–20)
CO2: 22 mmol/L (ref 20–29)
Calcium: 9.6 mg/dL (ref 8.7–10.2)
Chloride: 105 mmol/L (ref 96–106)
Creatinine, Ser: 0.95 mg/dL (ref 0.76–1.27)
GFR calc Af Amer: 121 mL/min/{1.73_m2} (ref >59)
GFR calc non Af Amer: 105 mL/min/{1.73_m2} (ref >59)
GLUCOSE: 95 mg/dL (ref 65–99)
Globulin, Total: 3.2 g/dL (ref 1.5–4.5)
Potassium: 4.3 mmol/L (ref 3.5–5.2)
Sodium: 144 mmol/L (ref 134–144)
TOTAL PROTEIN: 7.8 g/dL (ref 6.0–8.5)

## 2018-11-16 LAB — LIPID PANEL
CHOLESTEROL TOTAL: 156 mg/dL (ref 100–199)
Chol/HDL Ratio: 3.2 ratio (ref 0.0–5.0)
HDL: 49 mg/dL (ref >39)
LDL Calculated: 93 mg/dL (ref 0–99)
TRIGLYCERIDES: 69 mg/dL (ref 0–149)
VLDL CHOLESTEROL CAL: 14 mg/dL (ref 5–40)

## 2018-11-16 LAB — CBC
HEMOGLOBIN: 16.7 g/dL (ref 13.0–17.7)
Hematocrit: 48.9 % (ref 37.5–51.0)
MCH: 30.3 pg (ref 26.6–33.0)
MCHC: 34.2 g/dL (ref 31.5–35.7)
MCV: 89 fL (ref 79–97)
Platelets: 217 10*3/uL (ref 150–450)
RBC: 5.52 x10E6/uL (ref 4.14–5.80)
RDW: 12.6 % (ref 11.6–15.4)
WBC: 5.1 10*3/uL (ref 3.4–10.8)

## 2018-11-16 LAB — T4, FREE: Free T4: 1.2 ng/dL (ref 0.82–1.77)

## 2018-11-16 LAB — HEMOGLOBIN A1C
ESTIMATED AVERAGE GLUCOSE: 114 mg/dL
Hgb A1c MFr Bld: 5.6 % (ref 4.8–5.6)

## 2018-11-16 LAB — T3, FREE: T3, Free: 3.6 pg/mL (ref 2.0–4.4)

## 2018-11-16 LAB — TSH: TSH: 1.46 u[IU]/mL (ref 0.450–4.500)

## 2018-11-22 ENCOUNTER — Encounter: Payer: BC Managed Care – PPO | Admitting: Internal Medicine

## 2019-04-06 ENCOUNTER — Telehealth: Payer: Self-pay | Admitting: Internal Medicine

## 2019-04-06 ENCOUNTER — Other Ambulatory Visit: Payer: Self-pay

## 2019-04-06 ENCOUNTER — Ambulatory Visit (INDEPENDENT_AMBULATORY_CARE_PROVIDER_SITE_OTHER): Payer: BC Managed Care – PPO

## 2019-04-06 ENCOUNTER — Ambulatory Visit
Admission: EM | Admit: 2019-04-06 | Discharge: 2019-04-06 | Disposition: A | Discharge status: Home / Self Care | Payer: BC Managed Care – PPO | Attending: Urgent Care | Admitting: Urgent Care

## 2019-04-06 DIAGNOSIS — Z113 Encounter for screening for infections with a predominantly sexual mode of transmission: Secondary | ICD-10-CM | POA: Diagnosis not present

## 2019-04-06 DIAGNOSIS — Z7189 Other specified counseling: Secondary | ICD-10-CM | POA: Diagnosis not present

## 2019-04-06 DIAGNOSIS — R059 Cough, unspecified: Secondary | ICD-10-CM

## 2019-04-06 DIAGNOSIS — R05 Cough: Secondary | ICD-10-CM | POA: Diagnosis not present

## 2019-04-06 LAB — CHLAMYDIA/NGC RT PCR (ARMC ONLY): Chlamydia Tr: NOT DETECTED

## 2019-04-06 LAB — CHLAMYDIA/NGC RT PCR (ARMC ONLY)??????????: N gonorrhoeae: NOT DETECTED

## 2019-04-06 NOTE — Telephone Encounter (Signed)
Pt called and LM on VM to call back for appt for covid 19 testing.  Pt lives in [place].  Order placed just needs appt.

## 2019-04-06 NOTE — ED Triage Notes (Addendum)
Patient complains of cough x 1 week with no other symptoms. Patient states that cough is more dry than anything.   Patient would also like to be screened for stds. No issues.

## 2019-04-06 NOTE — ED Provider Notes (Signed)
[address], [address] [place], [place] [address] [phone]   Name: Jonathon LappingDerrick Panas DOB: 10/10/1986 MRN: [id number] CSN: [id number] PCP: Garey Hamodriguez-Southworth, Sylvia, PA-C  Arrival date and time:  04/06/19 1150  Chief Complaint:  Cough and SEXUALLY TRANSMITTED DISEASE  NOTE: Prior to seeing the patient today, I have reviewed the triage nursing documentation and vital signs. Clinical staff has updated patient's PMH/PSHx, current medication list, and drug allergies/intolerances to ensure comprehensive history available to assist in medical decision making.   History:   HPI: Jonathon Atkinson is a 33 y.o. male who presents today with complaints of a nonproductive cough x1 week.  Patient denies any associated fevers, chills, shortness of breath, and myalgias.  He has not experienced any nausea, vomiting, or diarrhea.  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 He notes recent transport of 2 different "sick" inmates from the jail to [facility].  Patient unable to advise on the symptomology and/or diagnosis of the inmates.  In the wake of the current COVID-19 pandemic, patient is concerned about possible infection.  Patient has not taken anything to help with his cough at home.  Additionally, patient requesting full screening for STI while he is in clinic today.  Patient endorses unprotected sexual activity.  He denies any symptoms commonly associated with STI; no penile pain, testicular pain, scrotal pain, penile discharge, or penile lesions.  Patient denies any urinary symptoms.  He states, "I just want get tested.  [facility], but I am here now so I just want to get it done".  In review of patient's EMR, patient had full STI testing in January 2020 that resulted as negative.   Past Medical History:  Diagnosis Date  . Asthma     Past Surgical History:  Procedure  Laterality Date  . LUNG SURGERY      Family History  Problem Relation Age of Onset  . Heart failure Mother   . Diabetes Father     Social History   Socioeconomic History  . Marital status: Single    Spouse name: Not on file  . Number of children: Not on file  . Years of education: Not on file  . Highest education level: Not on file  Occupational History  . Not on file  Social Needs  . Financial resource strain: Not on file  . Food insecurity:    Worry: Not on file    Inability: Not on file  . Transportation needs:    Medical: Not on file    Non-medical: Not on file  Tobacco Use  . Smoking status: Never Smoker  . Smokeless tobacco: Never Used  Substance and Sexual Activity  . Alcohol use: No    Frequency: Never  . Drug use: Never  . Sexual activity: Yes    Birth control/protection: Condom  Lifestyle  . Physical activity:    Days per week: Not on file    Minutes per session: Not on file  . Stress: Not on file  Relationships  . Social connections:    Talks on phone: Not on file    Gets together: Not on file    Attends religious service: Not on file    Active member of club or organization: Not on file    Attends meetings of clubs or organizations: Not on file    Relationship status: Not on file  . Intimate partner violence:  Fear of current or ex partner: Not on file    Emotionally abused: Not on file    Physically abused: Not on file    Forced sexual activity: Not on file  Other Topics Concern  . Not on file  Social History Narrative  . Not on file    Patient Active Problem List   Diagnosis Date Noted  . History of CVA (cerebrovascular accident) without residual deficits 11/15/2018  . Excessive cerumen in both ear canals 11/15/2018  . Flat feet, bilateral 11/15/2018  . Mild intermittent asthma without complication 11/15/2018  . Chronic pruritic rash in adult 11/01/2014    Home Medications:    No outpatient medications have been marked as taking  for the 04/06/19 encounter [facility](Hospital Encounter).    Allergies:   Sulfa antibiotics  Review of Systems (ROS): Review of Systems  Constitutional: Negative for chills and fever.  HENT: Negative for congestion, ear pain, rhinorrhea, sinus pain and sore throat.   Respiratory: Positive for cough (non-productive x 1 week). Negative for shortness of breath and wheezing.   Cardiovascular: Negative for chest pain and palpitations.  Gastrointestinal: Negative for abdominal pain, diarrhea, nausea and vomiting.  Genitourinary: Negative for discharge, dysuria, flank pain, frequency, genital sores, hematuria, penile pain, penile swelling, scrotal swelling, testicular pain and urgency.  Musculoskeletal: Negative for back pain, myalgias and neck pain.  Skin: Negative.   Neurological: Negative for dizziness, weakness, numbness and headaches.  Hematological: Negative for adenopathy.     Physical Exam:  Triage Vital Signs ED Triage Vitals  Enc Vitals Group     BP 04/06/19 1220 122/70     Pulse Rate 04/06/19 1220 (!) 53     Resp 04/06/19 1220 18     Temp 04/06/19 1220 98.3 F (36.8 C)     Temp Source 04/06/19 1220 Oral     SpO2 04/06/19 1220 99 %     Weight 04/06/19 1216 185 lb (83.9 kg)     Height 04/06/19 1216 5\' 9"  (1.753 m)     Head Circumference --      Peak Flow --      Pain Score 04/06/19 1216 0     Pain Loc --      Pain Edu? --      Excl. in GC? --     Physical Exam  Constitutional: He is oriented to person, place, and time and well-developed, well-nourished, and in no distress.  HENT:  Head: Normocephalic and atraumatic.  Right Ear: External ear normal.  Left Ear: External ear normal.  Mouth/Throat: Oropharynx is clear and moist and mucous membranes are normal.  Eyes: Pupils are equal, round, and reactive to light. Conjunctivae and EOM are normal.  Neck: Normal range of motion. Neck supple. No tracheal deviation present.  Cardiovascular: Regular rhythm, normal heart sounds and  intact distal pulses. Bradycardia present. Exam reveals no gallop and no friction rub.  No murmur heard. Pulmonary/Chest: Effort normal and breath sounds normal. No respiratory distress. He has no wheezes. He has no rales.  Lymphadenopathy:    He has no cervical adenopathy.  Neurological: He is alert and oriented to person, place, and time.  Skin: Skin is warm and dry. No rash noted. No erythema.  Psychiatric: Mood, memory, affect and judgment normal.  Nursing note and vitals reviewed.    Urgent Care Treatments / Results:   LABS: PLEASE NOTE: all labs that were ordered this encounter are listed, however only abnormal results are displayed. Labs Reviewed  CHLAMYDIA/NGC RT  PCR ([facility] ONLY)  RPR  HIV ANTIBODY (ROUTINE TESTING W REFLEX)  HEPATITIS PANEL, ACUTE  HSV 2 ANTIBODY, IGG    EKG: -None  RADIOLOGY: Dg Chest 2 View  Result Date: 04/06/2019 CLINICAL DATA:  Cough EXAM: CHEST - 2 VIEW COMPARISON:  August 12, 2018 FINDINGS: No edema or consolidation. There is slight scarring in the right upper lobe peripherally. Heart size and pulmonary vascularity are normal. No adenopathy. No evident bone lesions. IMPRESSION: Mild scarring right upper lobe. No edema or consolidation. No adenopathy. Heart size within normal limits. Electronically Signed   By: Lowella Grip III M.D.   On: 04/06/2019 13:24    PRODEDURES: Procedures  MEDICATIONS RECEIVED THIS VISIT: Medications - No data to display  PERTINENT CLINICAL COURSE NOTES/UPDATES:   Initial Impression / Assessment and Plan / Urgent Care Course:    Keonta Alsip is a 33 y.o. male who presents to [facility] today with complaints of Cough and SEXUALLY TRANSMITTED DISEASE  Pertinent labs & imaging results that were available during my care of the patient were personally reviewed by me and considered in my medical decision making (see lab/imaging section of note for values and interpretations).  Exam benign.  BBS  CTA.  No fevers or other symptoms.  Diagnostic radiographs of the chest negative for any focal consolidation, infiltrates, or evidence of peribronchial thickening.  Persistent cough felt to have an allergic component.  Patient advised to obtain some over-the-counter cetirizine and start medication daily.  Additionally, patient advised to obtain over-the-counter Robitussin to help with his cough.  Discussed his concerns related to SARS-CoV-2 at length.  Based on his current working conditions and possible exposure, coupled with his prolonged cough, it is reasonable to consider testing.  Patient scheduled, per his request, for testing on Monday (04/08/2019) at 12:00 PM.  Regarding STI screening.  Patient endorses unprotected sexual activity, and has concerns related to possible STI exposure.  Discussed screening with [facility] or PCP may be more financially beneficial to him, however patient adamant that testing be performed here while he is here today.  Will honor request for STI testing.  Blood and urine samples collected and sent.  Patient to be contacted via MyChart with results per his request.  Discussed follow up with primary care physician in 1 week for re-evaluation. I have reviewed the follow up and strict return precautions for any new or worsening symptoms. Patient is aware of symptoms that would be deemed urgent/emergent, and would thus require further evaluation either here or in the emergency department. At the time of discharge, he verbalized understanding and consent with the discharge plan as it was reviewed with him. All questions were fielded by provider and/or clinic staff prior to patient discharge.    Final Clinical Impressions(s) / Urgent Care Diagnoses:   Final diagnoses:  Cough  Routine screening for STI (sexually transmitted infection)  Advice Given About Covid-19 Virus Infection    New Prescriptions:  No orders of the defined types were placed in this  encounter.   Controlled Substance Prescriptions:   Controlled Substance Registry consulted? Not Applicable  NOTE: This note was prepared using Dragon dictation software along with smaller phrase technology. Despite my best ability to proofread, there is the potential that transcriptional errors may still occur from this process, and are completely unintentional.     Karen Kitchens, NP 04/06/19 1719

## 2019-04-06 NOTE — Discharge Instructions (Signed)
It was very nice meeting you today in clinic. Thank you for entrusting me with your care.   As discussed, your chest xray was negative. Most likely it is allergies. Use over the counter Robitussin as needed to help with cough. Start daily Zyrtec. Due to your risk of exposure, will send you for COVID testing, however my suspicion is low.   Labs for STD pending. We will contact you once everything results.   Make arrangements to follow up with your regular doctor in 1 week for re-evaluation. If your symptoms/condition worsens, please seek follow up care either here or in the ER. Please remember, our [place] providers are "right here with you" when you need [place].   Again, it was my pleasure to take care of you today. Thank you for choosing our clinic. I hope that you start to feel better quickly.   Honor Loh, MSN, APRN, FNP-C, CEN Advanced Practice Provider [facility]

## 2019-04-07 LAB — HEPATITIS PANEL, ACUTE
HCV Ab: <0.1 s/co ratio (ref 0.0–0.9)
Hep A IgM: NEGATIVE
Hep B C IgM: NEGATIVE
Hepatitis B Surface Ag: NEGATIVE

## 2019-04-07 LAB — RPR: RPR Ser Ql: NONREACTIVE

## 2019-04-07 LAB — HSV 2 ANTIBODY, IGG: HSV 2 Glycoprotein G Ab, IgG: <0.91 index (ref 0.00–0.90)

## 2019-04-07 LAB — HIV ANTIBODY (ROUTINE TESTING W REFLEX): HIV Screen 4th Generation wRfx: NONREACTIVE

## 2019-04-08 ENCOUNTER — Other Ambulatory Visit: Payer: Self-pay

## 2019-04-08 ENCOUNTER — Other Ambulatory Visit: Payer: BC Managed Care – PPO

## 2019-04-08 DIAGNOSIS — Z20822 Contact with and (suspected) exposure to covid-19: Secondary | ICD-10-CM

## 2019-04-09 LAB — NOVEL CORONAVIRUS, NAA: SARS-CoV-2, NAA: NOT DETECTED

## 2019-05-16 ENCOUNTER — Ambulatory Visit: Payer: BC Managed Care – PPO | Admitting: Internal Medicine

## 2019-06-06 ENCOUNTER — Ambulatory Visit (INDEPENDENT_AMBULATORY_CARE_PROVIDER_SITE_OTHER): Payer: BC Managed Care – PPO | Admitting: Internal Medicine

## 2019-06-06 ENCOUNTER — Encounter: Payer: Self-pay | Admitting: Internal Medicine

## 2019-06-06 ENCOUNTER — Other Ambulatory Visit: Payer: Self-pay

## 2019-06-06 VITALS — BP 130/72 | HR 53 | Temp 97.4°F | Ht 68.2 in | Wt 190.4 lb

## 2019-06-06 DIAGNOSIS — H6121 Impacted cerumen, right ear: Secondary | ICD-10-CM

## 2019-06-06 DIAGNOSIS — H6123 Impacted cerumen, bilateral: Secondary | ICD-10-CM

## 2019-06-06 NOTE — Progress Notes (Signed)
  Subjective:     Patient ID: Jonathon Atkinson , male    DOB: Dec 30, 1985 , 33 y.o.   MRN: [id number]   Chief Complaint  Patient presents with  . Cerumen Impaction    HPI Pt is here due to feeling his R ear feeling stuffy and thinks he has wax build up. Denies pain or other health concerns.    Past Medical History:  Diagnosis Date  . Asthma      Family History  Problem Relation Age of Onset  . Heart failure Mother   . Diabetes Father     No current outpatient medications on file.   Allergies  Allergen Reactions  . Sulfa Antibiotics      Review of Systems  Has stuffy R ear, L feels OK. Denies ringing in her ears. Denies rhinitis, ear drainage, fever, cough or chills.  Today's Vitals   06/06/19 0919  BP: 130/72  Pulse: (!) 53  Temp: (!) 97.4 F (36.3 C)  TempSrc: Oral  SpO2: 98%  Weight: 190 lb 6.4 oz (86.4 kg)  Height: 5' 8.2" (1.732 m)   Body mass index is 28.78 kg/m.   Objective:  Physical Exam Vitals signs and nursing note reviewed.  Constitutional:      General: He is not in acute distress.    Appearance: Normal appearance. He is not toxic-appearing.  HENT:     Head: Normocephalic.     Right Ear: Ear canal normal. There is impacted cerumen.     Left Ear: Tympanic membrane and ear canal normal. There is no impacted cerumen.     Ears:     Comments: R TM not visible due to cerumen impaction. L TM is partially visible and has mild wax buildup.     Nose: Nose normal.     Mouth/Throat:     Mouth: Mucous membranes are moist.  Eyes:     Conjunctiva/sclera: Conjunctivae normal.  Neck:     Musculoskeletal: Neck supple. No neck rigidity.  Pulmonary:     Effort: Pulmonary effort is normal.  Lymphadenopathy:     Cervical: No cervical adenopathy.  Skin:    General: Skin is warm and dry.  Neurological:     Mental Status: He is alert and oriented to person, place, and time.  Psychiatric:        Mood and Affect: Mood normal.        Behavior: Behavior normal.      Assessment And Plan:    1. Bilateral impacted cerumen-  Ear lavage performed by Tianna the MA. FU for physical in 6 months.    Fahad Cisse RODRIGUEZ-SOUTHWORTH, PA-C    THE PATIENT IS ENCOURAGED TO PRACTICE SOCIAL DISTANCING DUE TO THE COVID-19 PANDEMIC.

## 2019-11-09 ENCOUNTER — Encounter: Payer: Self-pay | Admitting: Emergency Medicine

## 2019-11-09 ENCOUNTER — Ambulatory Visit
Admission: EM | Admit: 2019-11-09 | Discharge: 2019-11-09 | Disposition: A | Discharge status: Home / Self Care | Payer: BC Managed Care – PPO | Attending: Family Medicine | Admitting: Family Medicine

## 2019-11-09 ENCOUNTER — Other Ambulatory Visit: Payer: Self-pay

## 2019-11-09 DIAGNOSIS — J029 Acute pharyngitis, unspecified: Secondary | ICD-10-CM

## 2019-11-09 DIAGNOSIS — R0982 Postnasal drip: Secondary | ICD-10-CM | POA: Insufficient documentation

## 2019-11-09 DIAGNOSIS — Z7189 Other specified counseling: Secondary | ICD-10-CM | POA: Diagnosis present

## 2019-11-09 DIAGNOSIS — J4520 Mild intermittent asthma, uncomplicated: Secondary | ICD-10-CM | POA: Insufficient documentation

## 2019-11-09 DIAGNOSIS — Z8673 Personal history of transient ischemic attack (TIA), and cerebral infarction without residual deficits: Secondary | ICD-10-CM | POA: Insufficient documentation

## 2019-11-09 DIAGNOSIS — Z113 Encounter for screening for infections with a predominantly sexual mode of transmission: Secondary | ICD-10-CM | POA: Diagnosis present

## 2019-11-09 DIAGNOSIS — Z20822 Contact with and (suspected) exposure to covid-19: Secondary | ICD-10-CM | POA: Diagnosis not present

## 2019-11-09 DIAGNOSIS — Z202 Contact with and (suspected) exposure to infections with a predominantly sexual mode of transmission: Secondary | ICD-10-CM | POA: Diagnosis not present

## 2019-11-09 DIAGNOSIS — Z833 Family history of diabetes mellitus: Secondary | ICD-10-CM | POA: Insufficient documentation

## 2019-11-09 DIAGNOSIS — Z20828 Contact with and (suspected) exposure to other viral communicable diseases: Secondary | ICD-10-CM

## 2019-11-09 LAB — GROUP A STREP BY PCR: Group A Strep by PCR: NOT DETECTED

## 2019-11-09 NOTE — Discharge Instructions (Signed)
Take over-the-counter Claritin or Zyrtec as discussed.  Monitor.  Rest.  Practice safe sex.  Follow up with your primary care physician this week as needed. Return to Urgent care for new or worsening concerns.

## 2019-11-09 NOTE — ED Provider Notes (Signed)
[facility] ____________________________________________  Time seen: Approximately 9:03 AM  I have reviewed the triage vital signs and the nursing notes.   HISTORY  Chief Complaint Sore Throat   HPI Jonathon Atkinson is a 34 y.o. male presenting for evaluation of nasal congestion and scratchy throat. States this has been present approximately 1 week.  States this is not uncommon for him.  States he often has allergies and postnasal drip.  Has not been taking medications for the same complaints.  Denies cough, fevers, changes in taste or smell, vomiting, diarrhea, chest pain or shortness of breath.  Denies known direct sick contacts.  States he had a Covid test negative this past week as well.  States he gets tested every 2 weeks through his work.  Patient also request to have STD testing while he is here.  States no recent change in partners but would like to just go ahead and have screening.  Patient request gonorrhea, chlamydia, HIV and RPR test.  Denies dysuria, penile or testicular pain, penile discharge, rash or lesions.   Rodriguez-Southworth, Nettie Elm, PA-C : PCP   Past Medical History:  Diagnosis Date  . Asthma     Patient Active Problem List   Diagnosis Date Noted  . History of CVA (cerebrovascular accident) without residual deficits 11/15/2018  . Excessive cerumen in both ear canals 11/15/2018  . Flat feet, bilateral 11/15/2018  . Mild intermittent asthma without complication 11/15/2018  . Chronic pruritic rash in adult 11/01/2014    Past Surgical History:  Procedure Laterality Date  . LUNG SURGERY       No current facility-administered medications for this encounter. No current outpatient medications on file.  Allergies Sulfa antibiotics  Family History  Problem Relation Age of Onset  . Heart failure Mother   . Diabetes Father     Social History Social History   Tobacco Use  . Smoking status: Never Smoker  . Smokeless tobacco: Never Used    Substance Use Topics  . Alcohol use: No  . Drug use: Never    Review of Systems Constitutional: No fever ENT: As above. Cardiovascular: Denies chest pain. Respiratory: Denies shortness of breath. Gastrointestinal: No abdominal pain.  No nausea, no vomiting.  No diarrhea. Musculoskeletal: Negative for back pain. Skin: Negative for rash.  ____________________________________________   PHYSICAL EXAM:  VITAL SIGNS: ED Triage Vitals  Enc Vitals Group     BP 11/09/19 0840 128/80     Pulse Rate 11/09/19 0840 (!) 54     Resp 11/09/19 0840 18     Temp 11/09/19 0840 98.1 F (36.7 C)     Temp Source 11/09/19 0840 Oral     SpO2 11/09/19 0840 99 %     Weight 11/09/19 0833 180 lb (81.6 kg)     Height 11/09/19 0833 5\' 8"  (1.727 m)     Head Circumference --      Peak Flow --      Pain Score 11/09/19 0833 5     Pain Loc --      Pain Edu? --      Excl. in GC? --     Constitutional: Alert and oriented. Well appearing and in no acute distress. Eyes: Conjunctivae are normal.  Head: Atraumatic. No sinus tenderness to palpation. No swelling. No erythema.  Ears: no erythema, normal TMs bilaterally.   Nose:Nasal congestion   Mouth/Throat: Mucous membranes are moist. No pharyngeal erythema. No tonsillar swelling or exudate.  Neck: No stridor.  No cervical spine tenderness  to palpation. Hematological/Lymphatic/Immunilogical: No cervical lymphadenopathy. Cardiovascular: Normal rate, regular rhythm. Grossly normal heart sounds.  Good peripheral circulation. Respiratory: Normal respiratory effort.  No retractions. No wheezes, rales or rhonchi. Good air movement.  Musculoskeletal: Ambulatory with steady gait.  Neurologic:  Normal speech and language. No gait instability. Skin:  Skin appears warm, dry and intact. No rash noted. Psychiatric: Mood and affect are normal. Speech and behavior are normal.  ___________________________________________   LABS (all labs ordered are listed, but only  abnormal results are displayed)  Labs Reviewed  GROUP A STREP BY PCR  NOVEL CORONAVIRUS, NAA (HOSP ORDER, SEND-OUT TO REF LAB; TAT 18-24 HRS)  GC/CHLAMYDIA PROBE AMP  HIV ANTIBODY (ROUTINE TESTING W REFLEX)  RPR    PROCEDURES Procedures    INITIAL IMPRESSION / ASSESSMENT AND PLAN / ED COURSE  Pertinent labs & imaging results that were available during my care of the patient were reviewed by me and considered in my medical decision making (see chart for details).  Well-appearing patient.  No acute distress.  Postnasal drip with nasal congestion and intermittent scratchy throat.  Recommend over-the-counter Claritin or Zyrtec.  Strep negative.  COVID-19 testing completed and advice given.  Work note given.  Screening for STDs also completed.  Safe sex practices recommended.  Discussed follow up and return parameters including no resolution or any worsening concerns. Patient verbalized understanding and agreed to plan.   ____________________________________________   FINAL CLINICAL IMPRESSION(S) / ED DIAGNOSES  Final diagnoses:  Post-nasal drip  Advice given about COVID-19 virus infection  Screen for STD (sexually transmitted disease)     ED Discharge Orders    None       Note: This dictation was prepared with Dragon dictation along with smaller phrase technology. Any transcriptional errors that result from this process are unintentional.         Marylene Land, NP 11/09/19 [phone]

## 2019-11-09 NOTE — ED Triage Notes (Signed)
Pt c/o sore throat and sneezing. Started about a week ago. Denies fever, headache, cough or congestion. He also requesting being tested for STDs. He was tested for covid about 2 weeks ago and gets tested every 2 weeks and was negative.

## 2019-11-10 LAB — NOVEL CORONAVIRUS, NAA (HOSP ORDER, SEND-OUT TO REF LAB; TAT 18-24 HRS): SARS-CoV-2, NAA: NOT DETECTED

## 2019-11-10 LAB — RPR: RPR Ser Ql: NONREACTIVE

## 2019-11-10 LAB — HIV ANTIBODY (ROUTINE TESTING W REFLEX): HIV Screen 4th Generation wRfx: NONREACTIVE

## 2019-11-11 LAB — GC/CHLAMYDIA PROBE AMP
Chlamydia trachomatis, NAA: NEGATIVE
Neisseria Gonorrhoeae by PCR: NEGATIVE

## 2019-12-12 ENCOUNTER — Ambulatory Visit: Payer: BC Managed Care – PPO | Admitting: Internal Medicine

## 2019-12-19 ENCOUNTER — Ambulatory Visit: Payer: BC Managed Care – PPO | Admitting: Internal Medicine

## 2019-12-25 ENCOUNTER — Other Ambulatory Visit (HOSPITAL_COMMUNITY)
Admission: RE | Admit: 2019-12-25 | Discharge: 2019-12-25 | Disposition: A | Discharge status: Home / Self Care | Payer: BC Managed Care – PPO | Source: Ambulatory Visit | Attending: Internal Medicine | Admitting: Internal Medicine

## 2019-12-25 ENCOUNTER — Other Ambulatory Visit: Payer: Self-pay

## 2019-12-25 ENCOUNTER — Ambulatory Visit: Payer: BC Managed Care – PPO | Admitting: Internal Medicine

## 2019-12-25 ENCOUNTER — Encounter: Payer: Self-pay | Admitting: Internal Medicine

## 2019-12-25 VITALS — BP 122/76 | HR 67 | Temp 98.2°F | Ht 68.0 in | Wt 191.8 lb

## 2019-12-25 DIAGNOSIS — H6123 Impacted cerumen, bilateral: Secondary | ICD-10-CM

## 2019-12-25 DIAGNOSIS — Z6829 Body mass index (BMI) 29.0-29.9, adult: Secondary | ICD-10-CM | POA: Diagnosis not present

## 2019-12-25 DIAGNOSIS — Z113 Encounter for screening for infections with a predominantly sexual mode of transmission: Secondary | ICD-10-CM

## 2019-12-25 DIAGNOSIS — E663 Overweight: Secondary | ICD-10-CM

## 2019-12-25 NOTE — Progress Notes (Signed)
  This visit occurred during the SARS-CoV-2 public health emergency.  Safety protocols were in place, including screening questions prior to the visit, additional usage of staff PPE, and extensive cleaning of exam room while observing appropriate contact time as indicated for disinfecting solutions.  Subjective:     Patient ID: Jonathon Atkinson , male    DOB: 03-Mar-1986 , 34 y.o.   MRN: [id number]   Chief Complaint  Patient presents with  . Hyperlipidemia    HPI 1- Wants his ears checked since he tends to have cerumen impaction 2- would like STD screen, denies symptoms or exposure.   Past Medical History:  Diagnosis Date  . Asthma      Family History  Problem Relation Age of Onset  . Heart failure Mother   . Diabetes Father     No current outpatient medications on file.   Allergies  Allergen Reactions  . Sulfa Antibiotics      Review of Systems  Noticed slight decreased hearing from R ear, otherwise the rest of  10 point ROS is neg Today's Vitals   12/25/19 1410  BP: 122/76  Pulse: 67  Temp: 98.2 F (36.8 C)  TempSrc: Oral  Weight: 191 lb 12.8 oz (87 kg)  Height: 5\' 8"  (1.727 m)   Body mass index is 29.16 kg/m.   Objective:  Physical Exam  Constitutional: he is oriented to person, place, and time. he appears well-developed and well-nourished. No distress.  HENT:  Head: Normocephalic and atraumatic.  Right Ear: External ear normal. Has moderate wax, TM was not visible. But after lavage, it is normal.  Left Ear: External ear normal. Mild wax present, after lavage TM is normal.  Nose: Nose normal.  Eyes: Conjunctivae are normal. Right eye exhibits no discharge. Left eye exhibits no discharge. No scleral icterus.  Neck: Neck supple. No thyromegaly present.  No carotid bruits bilaterally  Cardiovascular: Normal rate and regular rhythm.  No murmur heard. Pulmonary/Chest: Effort normal and breath sounds normal. No respiratory distress.  Musculoskeletal: Normal  range of motion.   he has no cervical adenopathy.  Neurological: he is alert and oriented to person, place, and time.  Skin: Skin is warm and dry. No rash noted, he is not diaphoretic.  Psychiatric: he has a normal mood and affect. His behavior is normal. Judgment and thought content normal.  Nursing note reviewed.  Assessment And Plan:   1. Screen for STD (sexually transmitted disease)- screen - Urine cytology ancillary only - HIV antibody (with reflex)  2. Overweight (BMI 25.0-29.9)- is eating healthier and exercising. Will continue this. Wt is unchanged. Fu for physical next month  3. Bilateral impacted cerumen- acute. Bilateral ear lavage done and cerumen was removed.  FU prn.   Cutler Sunday RODRIGUEZ-SOUTHWORTH, PA-C    THE PATIENT IS ENCOURAGED TO PRACTICE SOCIAL DISTANCING DUE TO THE COVID-19 PANDEMIC.

## 2019-12-25 NOTE — Patient Instructions (Signed)
Earwax Buildup, Adult The ears produce a substance called earwax that helps keep bacteria out of the ear and protects the skin in the ear canal. Occasionally, earwax can build up in the ear and cause discomfort or hearing loss. What increases the risk? This condition is more likely to develop in people who:  Are male.  Are elderly.  Naturally produce more earwax.  Clean their ears often with cotton swabs.  Use earplugs often.  Use in-ear headphones often.  Wear hearing aids.  Have narrow ear canals.  Have earwax that is overly thick or sticky.  Have eczema.  Are dehydrated.  Have excess hair in the ear canal. What are the signs or symptoms? Symptoms of this condition include:  Reduced or muffled hearing.  A feeling of fullness in the ear or feeling that the ear is plugged.  Fluid coming from the ear.  Ear pain.  Ear itch.  Ringing in the ear.  Coughing.  An obvious piece of earwax that can be seen inside the ear canal. How is this diagnosed? This condition may be diagnosed based on:  Your symptoms.  Your medical history.  An ear exam. During the exam, your health care provider will look into your ear with an instrument called an otoscope. You may have tests, including a hearing test. How is this treated? This condition may be treated by:  Using ear drops to soften the earwax.  Having the earwax removed by a health care provider. The health care provider may: ? Flush the ear with water. ? Use an instrument that has a loop on the end (curette). ? Use a suction device.  Surgery to remove the wax buildup. This may be done in severe cases. Follow these instructions at home:   Take over-the-counter and prescription medicines only as told by your health care provider.  Do not put any objects, including cotton swabs, into your ear. You can clean the opening of your ear canal with a washcloth or facial tissue.  Follow instructions from your health care  provider about cleaning your ears. Do not over-clean your ears.  Drink enough fluid to keep your urine clear or pale yellow. This will help to thin the earwax.  Keep all follow-up visits as told by your health care provider. If earwax builds up in your ears often or if you use hearing aids, consider seeing your health care provider for routine, preventive ear cleanings. Ask your health care provider how often you should schedule your cleanings.  If you have hearing aids, clean them according to instructions from the manufacturer and your health care provider. Contact a health care provider if:  You have ear pain.  You develop a fever.  You have blood, pus, or other fluid coming from your ear.  You have hearing loss.  You have ringing in your ears that does not go away.  Your symptoms do not improve with treatment.  You feel like the room is spinning (vertigo). Summary  Earwax can build up in the ear and cause discomfort or hearing loss.  The most common symptoms of this condition include reduced or muffled hearing and a feeling of fullness in the ear or feeling that the ear is plugged.  This condition may be diagnosed based on your symptoms, your medical history, and an ear exam.  This condition may be treated by using ear drops to soften the earwax or by having the earwax removed by a health care provider.  Do not put any   objects, including cotton swabs, into your ear. You can clean the opening of your ear canal with a washcloth or facial tissue. This information is not intended to replace advice given to you by your health care provider. Make sure you discuss any questions you have with your health care provider. Document Revised: 09/29/2017 Document Reviewed: 12/28/2016 Elsevier Patient Education  2020 [employer]  

## 2019-12-26 LAB — HIV ANTIBODY (ROUTINE TESTING W REFLEX): HIV Screen 4th Generation wRfx: NONREACTIVE

## 2019-12-27 LAB — URINE CYTOLOGY ANCILLARY ONLY
Chlamydia: NEGATIVE
Comment: NEGATIVE
Comment: NEGATIVE
Comment: NORMAL
Neisseria Gonorrhea: NEGATIVE
Trichomonas: NEGATIVE

## 2020-01-16 ENCOUNTER — Encounter: Payer: BC Managed Care – PPO | Admitting: Internal Medicine

## 2020-02-13 ENCOUNTER — Encounter: Payer: BC Managed Care – PPO | Admitting: Internal Medicine

## 2020-02-27 ENCOUNTER — Encounter: Payer: BC Managed Care – PPO | Admitting: Internal Medicine

## 2020-03-05 ENCOUNTER — Ambulatory Visit (INDEPENDENT_AMBULATORY_CARE_PROVIDER_SITE_OTHER): Payer: BC Managed Care – PPO | Admitting: Internal Medicine

## 2020-03-05 ENCOUNTER — Other Ambulatory Visit: Payer: Self-pay

## 2020-03-05 ENCOUNTER — Encounter: Payer: BC Managed Care – PPO | Admitting: Internal Medicine

## 2020-03-05 ENCOUNTER — Encounter: Payer: Self-pay | Admitting: Internal Medicine

## 2020-03-05 VITALS — BP 120/78 | HR 64 | Temp 97.4°F | Ht 68.0 in | Wt 197.0 lb

## 2020-03-05 DIAGNOSIS — Z0000 Encounter for general adult medical examination without abnormal findings: Secondary | ICD-10-CM | POA: Diagnosis not present

## 2020-03-05 DIAGNOSIS — Z113 Encounter for screening for infections with a predominantly sexual mode of transmission: Secondary | ICD-10-CM | POA: Diagnosis not present

## 2020-03-05 DIAGNOSIS — E663 Overweight: Secondary | ICD-10-CM | POA: Diagnosis not present

## 2020-03-05 NOTE — Patient Instructions (Signed)
Preventive Care 19-35 Years Old, Male Preventive care refers to lifestyle choices and visits with your health care provider that can promote health and wellness. This includes:  A yearly physical exam. This is also called an annual well check.  Regular dental and eye exams.  Immunizations.  Screening for certain conditions.  Healthy lifestyle choices, such as eating a healthy diet, getting regular exercise, not using drugs or products that contain nicotine and tobacco, and limiting alcohol use. What can I expect for my preventive care visit? Physical exam Your health care provider will check:  Height and weight. These may be used to calculate body mass index (BMI), which is a measurement that tells if you are at a healthy weight.  Heart rate and blood pressure.  Your skin for abnormal spots. Counseling Your health care provider may ask you questions about:  Alcohol, tobacco, and drug use.  Emotional well-being.  Home and relationship well-being.  Sexual activity.  Eating habits.  Work and work Statistician. What immunizations do I need?  Influenza (flu) vaccine  This is recommended every year. Tetanus, diphtheria, and pertussis (Tdap) vaccine  You may need a Td booster every 10 years. Varicella (chickenpox) vaccine  You may need this vaccine if you have not already been vaccinated. Human papillomavirus (HPV) vaccine  If recommended by your health care provider, you may need three doses over 6 months. Measles, mumps, and rubella (MMR) vaccine  You may need at least one dose of MMR. You may also need a second dose. Meningococcal conjugate (MenACWY) vaccine  One dose is recommended if you are 45-76 years old and a Market researcher living in a residence hall, or if you have one of several medical conditions. You may also need additional booster doses. Pneumococcal conjugate (PCV13) vaccine  You may need this if you have certain conditions and were not  previously vaccinated. Pneumococcal polysaccharide (PPSV23) vaccine  You may need one or two doses if you smoke cigarettes or if you have certain conditions. Hepatitis A vaccine  You may need this if you have certain conditions or if you travel or work in places where you may be exposed to hepatitis A. Hepatitis B vaccine  You may need this if you have certain conditions or if you travel or work in places where you may be exposed to hepatitis B. Haemophilus influenzae type b (Hib) vaccine  You may need this if you have certain risk factors. You may receive vaccines as individual doses or as more than one vaccine together in one shot (combination vaccines). Talk with your health care provider about the risks and benefits of combination vaccines. What tests do I need? Blood tests  Lipid and cholesterol levels. These may be checked every 5 years starting at age 17.  Hepatitis C test.  Hepatitis B test. Screening   Diabetes screening. This is done by checking your blood sugar (glucose) after you have not eaten for a while (fasting).  Sexually transmitted disease (STD) testing. Talk with your health care provider about your test results, treatment options, and if necessary, the need for more tests. Follow these instructions at home: Eating and drinking   Eat a diet that includes fresh fruits and vegetables, whole grains, lean protein, and low-fat dairy products.  Take vitamin and mineral supplements as recommended by your health care provider.  Do not drink alcohol if your health care provider tells you not to drink.  If you drink alcohol: ? Limit how much you have to 0-2  drinks a day. ? Be aware of how much alcohol is in your drink. In the [place] one drink equals one 12 oz bottle of beer (355 mL), one 5 oz glass of wine (148 mL), or one 1 oz glass of hard liquor (44 mL). Lifestyle  Take daily care of your teeth and gums.  Stay active. Exercise for at least 30 minutes on 5 or  more days each week.  Do not use any products that contain nicotine or tobacco, such as cigarettes, e-cigarettes, and chewing tobacco. If you need help quitting, ask your health care provider.  If you are sexually active, practice safe sex. Use a condom or other form of protection to prevent STIs (sexually transmitted infections). What's next?  Go to your health care provider once a year for a well check visit.  Ask your health care provider how often you should have your eyes and teeth checked.  Stay up to date on all vaccines. This information is not intended to replace advice given to you by your health care provider. Make sure you discuss any questions you have with your health care provider. Document Revised: 10/11/2018 Document Reviewed: 10/11/2018 Elsevier Patient Education  2020 [employer].

## 2020-03-05 NOTE — Progress Notes (Signed)
This visit occurred during the SARS-CoV-2 public health emergency.  Safety protocols were in place, including screening questions prior to the visit, additional usage of staff PPE, and extensive cleaning of exam room while observing appropriate contact time as indicated for disinfecting solutions.  Subjective:     Patient ID: Jonathon Atkinson , male    DOB: 12/29/1985 , 34 y.o.   MRN: [id number]   Chief Complaint  Patient presents with  . Annual Exam    HPI Here for yearly physical, and is interested in his blood type and STD screen. Denies high risk sexual behavior. Does not have any complaints today.  Has not been exercising as usual, but only twice a week for 1 h each.   Past Medical History:  Diagnosis Date  . Asthma      Family History  Problem Relation Age of Onset  . Heart failure Mother   . Diabetes Father     No current outpatient medications on file.   Allergies  Allergen Reactions  . Sulfa Antibiotics      Review of Systems  Denies ENT, issues, neck problems, GU or GI symptoms, denies high risk sexual behavior, CP, SOB, arthralgias, dizziness or numbness.  Today's Vitals   03/05/20 0913  BP: 120/78  Pulse: 64  Temp: (!) 97.4 F (36.3 C)  TempSrc: Oral  SpO2: 98%  Weight: 197 lb (89.4 kg)  Height: 5\' 8"  (1.727 m)   Body mass index is 29.95 kg/m.   Objective:  Physical Exam  BP 120/78 (BP Location: Right Arm, Patient Position: Sitting, Cuff Size: Normal)   Pulse 64   Temp (!) 97.4 F (36.3 C) (Oral)   Ht 5\' 8"  (1.727 m)   Wt 197 lb (89.4 kg)   SpO2 98%   BMI 29.95 kg/m   General Appearance:    Alert, cooperative, no distress, appears stated age  Head:    Normocephalic, without obvious abnormality, atraumatic  Eyes:    PERRL, conjunctiva/corneas clear, EOM's intact, fundi    benign, both eyes       Ears:    Normal TM's and external ear canals, both ears  Nose:   Nares normal, septum midline, mucosa normal, no drainage   or sinus tenderness   Throat:   Lips, mucosa, and tongue normal; teeth and gums normal  Neck:   Supple, symmetrical, trachea midline, no adenopathy;       thyroid:  No enlargement/tenderness/nodules; no carotid   bruit  Back:     Symmetric, no curvature, ROM normal, no CVA tenderness  Lungs:     Clear to auscultation bilaterally, respirations unlabored  Chest wall:    No tenderness or deformity  Heart:    Regular rate and rhythm, S1 and S2 normal, no murmur, rub   or gallop  Abdomen:     Soft, non-tender, bowel sounds active all four quadrants,    no masses, no organomegaly  Genitalia:    declined     Extremities:   Extremities normal, atraumatic, no cyanosis or edema  Pulses:   2+ and symmetric all extremities  Skin:   Skin color, texture, turgor normal, no rashes or lesions  Lymph nodes:   Cervical, supraclavicular, and axillary nodes normal  Neurologic:   CNII-XII intact. Normal strength, sensation and reflexes      throughout    Assessment And Plan:     1. Routine medical exam- routine. Fu 1 y with Janece  - Type and screen; Future - CMP14 + Anion  Gap - HSV Type I/II IgG, IgMw/ reflex - Lipid Profile - CBC no Diff - TSH - RPR+HIV+GC+CT Panel  2. Overweight (BMI 25.0-29.9)- advised to work on avoiding getting BMI > 30, and work on wt loss   3. Screen for STD (sexually transmitted disease)- screen  We will inform him about his lab work when they are back.     Rodger Giangregorio RODRIGUEZ-SOUTHWORTH, PA-C    THE PATIENT IS ENCOURAGED TO PRACTICE SOCIAL DISTANCING DUE TO THE COVID-19 PANDEMIC.

## 2020-03-06 LAB — CMP14 + ANION GAP
ALT: 45 IU/L — ABNORMAL HIGH (ref 0–44)
AST: 26 IU/L (ref 0–40)
Albumin/Globulin Ratio: 1.6 (ref 1.2–2.2)
Albumin: 4.7 g/dL (ref 4.0–5.0)
Alkaline Phosphatase: 65 IU/L (ref 39–117)
Anion Gap: 17 mmol/L (ref 10.0–18.0)
BUN/Creatinine Ratio: 15 (ref 9–20)
BUN: 14 mg/dL (ref 6–20)
Bilirubin Total: 0.5 mg/dL (ref 0.0–1.2)
CO2: 22 mmol/L (ref 20–29)
Calcium: 9.3 mg/dL (ref 8.7–10.2)
Chloride: 101 mmol/L (ref 96–106)
Creatinine, Ser: 0.95 mg/dL (ref 0.76–1.27)
GFR calc Af Amer: 120 mL/min/{1.73_m2} (ref >59)
GFR calc non Af Amer: 104 mL/min/{1.73_m2} (ref >59)
Globulin, Total: 3 g/dL (ref 1.5–4.5)
Glucose: 87 mg/dL (ref 65–99)
Potassium: 3.9 mmol/L (ref 3.5–5.2)
Sodium: 140 mmol/L (ref 134–144)
Total Protein: 7.7 g/dL (ref 6.0–8.5)

## 2020-03-06 LAB — CBC
Hematocrit: 47.1 % (ref 37.5–51.0)
Hemoglobin: 15.8 g/dL (ref 13.0–17.7)
MCH: 30.2 pg (ref 26.6–33.0)
MCHC: 33.5 g/dL (ref 31.5–35.7)
MCV: 90 fL (ref 79–97)
Platelets: 215 10*3/uL (ref 150–450)
RBC: 5.24 x10E6/uL (ref 4.14–5.80)
RDW: 12.3 % (ref 11.6–15.4)
WBC: 5.1 10*3/uL (ref 3.4–10.8)

## 2020-03-06 LAB — LIPID PANEL
Chol/HDL Ratio: 2.7 ratio (ref 0.0–5.0)
Cholesterol, Total: 148 mg/dL (ref 100–199)
HDL: 55 mg/dL (ref >39)
LDL Chol Calc (NIH): 81 mg/dL (ref 0–99)
Triglycerides: 56 mg/dL (ref 0–149)
VLDL Cholesterol Cal: 12 mg/dL (ref 5–40)

## 2020-03-06 LAB — TSH: TSH: 2.51 u[IU]/mL (ref 0.450–4.500)

## 2020-03-08 LAB — RPR+HIV+GC+CT PANEL
Chlamydia trachomatis, NAA: NEGATIVE
HIV Screen 4th Generation wRfx: NONREACTIVE
Neisseria Gonorrhoeae by PCR: NEGATIVE
RPR Ser Ql: NONREACTIVE

## 2020-03-08 LAB — HSV TYPE I/II IGG, IGMW/ REFLEX
HSV 1 Glycoprotein G Ab, IgG: 22.9 index — ABNORMAL HIGH (ref 0.00–0.90)
HSV 1 IgM: <1:10 {titer}
HSV 2 IgG, Type Spec: <0.91 index (ref 0.00–0.90)
HSV 2 IgM: <1:10 {titer}

## 2020-03-17 ENCOUNTER — Other Ambulatory Visit: Payer: Self-pay

## 2020-03-17 ENCOUNTER — Ambulatory Visit
Admission: EM | Admit: 2020-03-17 | Discharge: 2020-03-17 | Disposition: A | Discharge status: Home / Self Care | Payer: BC Managed Care – PPO

## 2020-03-17 DIAGNOSIS — J069 Acute upper respiratory infection, unspecified: Secondary | ICD-10-CM | POA: Diagnosis not present

## 2020-03-17 MED ORDER — ALBUTEROL SULFATE HFA 108 (90 BASE) MCG/ACT IN AERS: 2.0000 | INHALATION_SPRAY | RESPIRATORY_TRACT | 0 refills | Status: DC | PRN

## 2020-03-17 MED ORDER — IBUPROFEN 800 MG PO TABS: 800.0000 mg | ORAL_TABLET | Freq: Once | ORAL | Status: DC

## 2020-03-17 MED ORDER — SILVER SULFADIAZINE 1 % EX CREA: 1.0000 "application " | TOPICAL_CREAM | Freq: Every day | CUTANEOUS | 0 refills | Status: DC

## 2020-03-17 MED ORDER — BENZONATATE 100 MG PO CAPS: 100.0000 mg | ORAL_CAPSULE | Freq: Three times a day (TID) | ORAL | 0 refills | Status: DC | PRN

## 2020-03-17 MED ORDER — SILVER SULFADIAZINE 1 % EX CREA: TOPICAL_CREAM | Freq: Once | CUTANEOUS | Status: DC

## 2020-03-17 NOTE — Discharge Instructions (Addendum)
Take the [facility] as needed for cough.  Use the albuterol inhaler as needed.    Follow up with your primary care provider if your symptoms are not improving.

## 2020-03-17 NOTE — ED Provider Notes (Signed)
Renaldo Fiddler    CSN: [id number] Arrival date & time: 03/17/20  9381      History   Chief Complaint Chief Complaint  Patient presents with  . Nasal Congestion  . Cough    HPI Jonathon Atkinson is a 34 y.o. male.   Patient presents with 5-day history of cough productive of yellow phlegm and nasal congestion.  He has been treating his symptoms with Mucinex and Sudafed.  He denies fever, chills, shortness of breath, vomiting, diarrhea, rash, or other symptoms.  Patient received the second dose of Moderna COVID vaccine yesterday.  Medical history includes Asthma as a child.      The history is provided by the patient.    Past Medical History:  Diagnosis Date  . Asthma     Patient Active Problem List   Diagnosis Date Noted  . History of CVA (cerebrovascular accident) without residual deficits 11/15/2018  . Excessive cerumen in both ear canals 11/15/2018  . Flat feet, bilateral 11/15/2018  . Mild intermittent asthma without complication 11/15/2018  . Chronic pruritic rash in adult 11/01/2014    Past Surgical History:  Procedure Laterality Date  . LUNG SURGERY         Home Medications    Prior to Admission medications   Medication Sig Start Date End Date Taking? Authorizing Provider  pseudoephedrine (SUDAFED) 60 MG tablet Take 60 mg by mouth every 4 (four) hours as needed for congestion.   Yes [provider]  Pseudoephedrine-guaiFENesin (MUCINEX D PO) Take by mouth.   Yes [provider]  albuterol (VENTOLIN HFA) 108 (90 Base) MCG/ACT inhaler Inhale 2 puffs into the lungs every 4 (four) hours as needed for wheezing or shortness of breath. 03/17/20   Mickie Bail, NP  benzonatate (TESSALON) 100 MG capsule Take 1 capsule (100 mg total) by mouth 3 (three) times daily as needed for cough. 03/17/20   Mickie Bail, NP    Family History Family History  Problem Relation Age of Onset  . Heart failure Mother   . Diabetes Father     Social  History Social History   Tobacco Use  . Smoking status: Never Smoker  . Smokeless tobacco: Never Used  Substance Use Topics  . Alcohol use: No  . Drug use: Never     Allergies   Sulfa antibiotics   Review of Systems Review of Systems  Constitutional: Negative for chills and fever.  HENT: Positive for congestion. Negative for ear pain and sore throat.   Eyes: Negative for pain and visual disturbance.  Respiratory: Positive for cough. Negative for shortness of breath.   Cardiovascular: Negative for chest pain and palpitations.  Gastrointestinal: Negative for abdominal pain, diarrhea, nausea and vomiting.  Genitourinary: Negative for dysuria and hematuria.  Musculoskeletal: Negative for arthralgias and back pain.  Skin: Negative for color change and rash.  Neurological: Negative for seizures and syncope.  All other systems reviewed and are negative.    Physical Exam Triage Vital Signs ED Triage Vitals [03/17/20 0940]  Enc Vitals Group     BP      Pulse      Resp      Temp      Temp src      SpO2      Weight      Height      Head Circumference      Peak Flow      Pain Score 0     Pain Loc  Pain Edu?      Excl. in [place]?    No data found.  Updated Vital Signs BP 119/77 (BP Location: Left Arm)   Pulse 85   Temp 99.7 F (37.6 C) (Oral)   Resp 18   SpO2 96%   Visual Acuity Right Eye Distance:   Left Eye Distance:   Bilateral Distance:    Right Eye Near:   Left Eye Near:    Bilateral Near:     Physical Exam Vitals and nursing note reviewed.  Constitutional:      General: He is not in acute distress.    Appearance: He is well-developed. He is not ill-appearing.  HENT:     Head: Normocephalic and atraumatic.     Right Ear: Tympanic membrane normal.     Left Ear: Tympanic membrane normal.     Nose: Nose normal.     Mouth/Throat:     Mouth: Mucous membranes are moist.     Pharynx: Oropharynx is clear.  Eyes:     Conjunctiva/sclera: Conjunctivae  normal.  Cardiovascular:     Rate and Rhythm: Normal rate and regular rhythm.     Heart sounds: No murmur.  Pulmonary:     Effort: Pulmonary effort is normal. No respiratory distress.     Breath sounds: Normal breath sounds. No wheezing or rhonchi.  Abdominal:     Palpations: Abdomen is soft.     Tenderness: There is no abdominal tenderness. There is no guarding or rebound.  Musculoskeletal:     Cervical back: Neck supple.  Skin:    General: Skin is warm and dry.     Findings: No rash.  Neurological:     General: No focal deficit present.     Mental Status: He is alert and oriented to person, place, and time.  Psychiatric:        Mood and Affect: Mood normal.        Behavior: Behavior normal.      UC Treatments / Results  Labs (all labs ordered are listed, but only abnormal results are displayed) Labs Reviewed - No data to display  EKG   Radiology No results found.  Procedures Procedures (including critical care time)  Medications Ordered in UC Medications - No data to display  Initial Impression / Assessment and Plan / UC Course  I have reviewed the triage vital signs and the nursing notes.  Pertinent labs & imaging results that were available during my care of the patient were reviewed by me and considered in my medical decision making (see chart for details).   URI.  Patient is well-appearing and his exam is unremarkable.  Treating with Tessalon Perles and albuterol inhaler as needed.  Instructed patient to follow-up with his PCP if his symptoms or not improving.  Patient agrees to plan of care.   Final Clinical Impressions(s) / UC Diagnoses   Final diagnoses:  Upper respiratory tract infection, unspecified type     Discharge Instructions     Take the Tessalon Perles as needed for cough.  Use the albuterol inhaler as needed.    Follow up with your primary care provider if your symptoms are not improving.         ED Prescriptions    Medication  Sig Dispense Auth. Provider   silver sulfADIAZINE (SILVADENE) 1 % cream  (Status: Discontinued) Apply 1 application topically daily. 50 g Sharion Balloon, NP   benzonatate (TESSALON) 100 MG capsule Take 1 capsule (100 mg total) by  mouth 3 (three) times daily as needed for cough. 21 capsule Mickie Bail, NP   albuterol (VENTOLIN HFA) 108 (90 Base) MCG/ACT inhaler Inhale 2 puffs into the lungs every 4 (four) hours as needed for wheezing or shortness of breath. 18 g Mickie Bail, NP     PDMP not reviewed this encounter.   Mickie Bail, NP 03/17/20 1022

## 2020-03-17 NOTE — ED Triage Notes (Signed)
Pt reports cough and nasal congestion x 1 week. Mucinex and Sudafed give some relief. Pt reports he had the second Moderna COVID vaccine dose yesterday.

## 2021-03-03 IMAGING — CR CHEST - 2 VIEW
2 series · 2 of 2 positions shown · non-contrast
Comparison: August 12, 2018

CLINICAL DATA: Cough

EXAM:
CHEST - 2 VIEW

[chest pa]
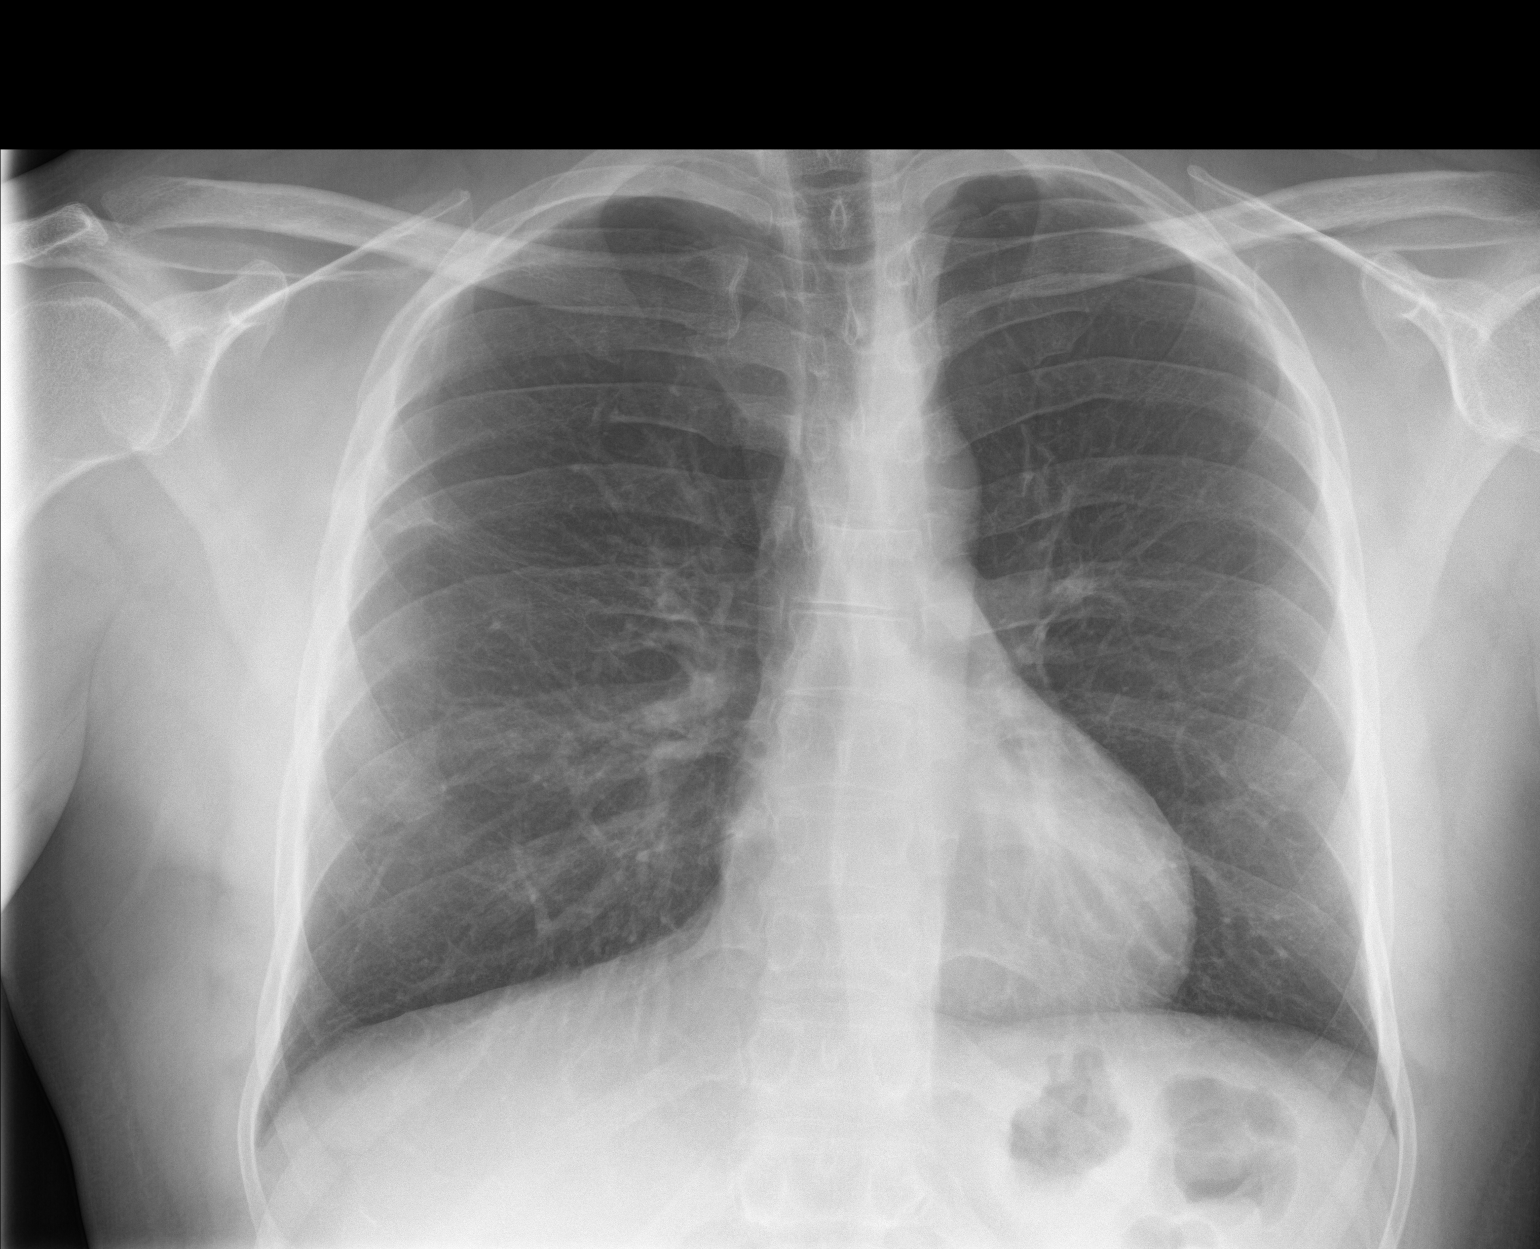

[chest lat]
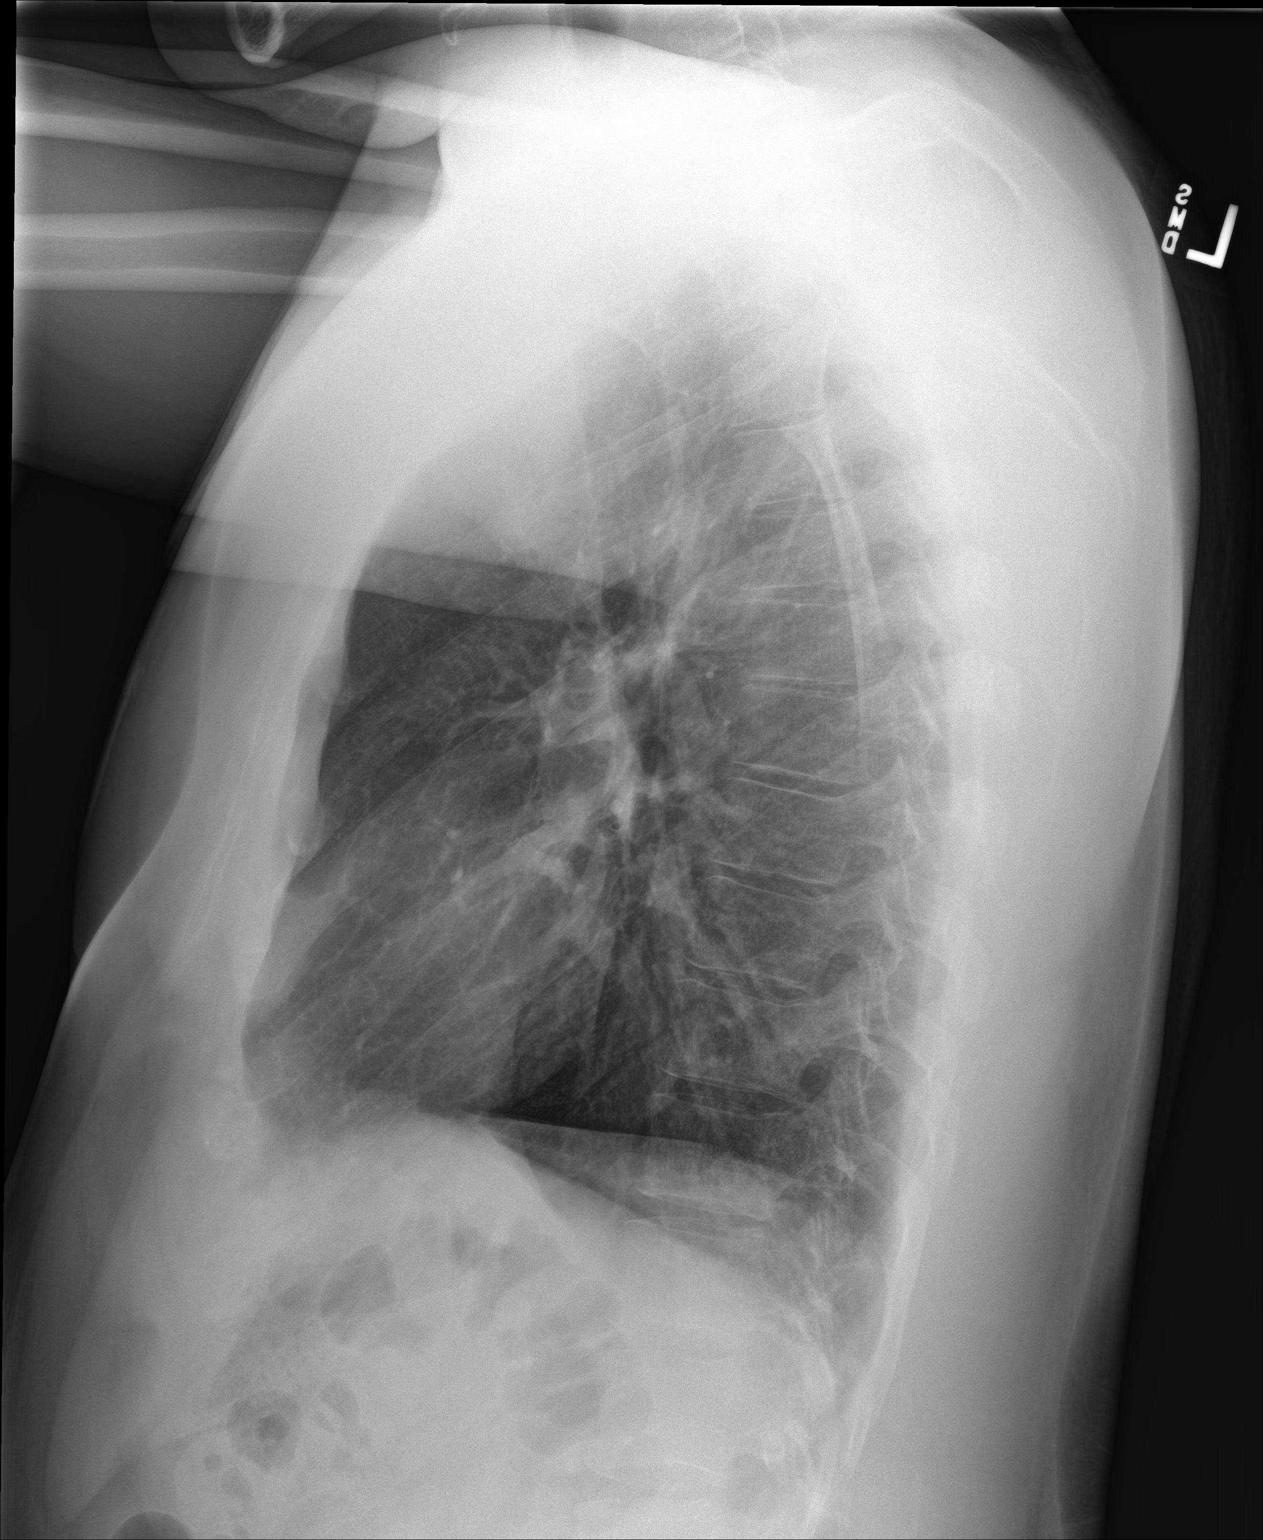

[2 of 2 positions shown; findings below may reference images not displayed]

FINDINGS: No edema or consolidation. There is slight scarring in the right
upper lobe peripherally. Heart size and pulmonary vascularity are
normal. No adenopathy. No evident bone lesions.
IMPRESSION: Mild scarring right upper lobe. No edema or consolidation. No
adenopathy. Heart size within normal limits.

## 2021-03-08 ENCOUNTER — Encounter: Payer: BC Managed Care – PPO | Admitting: Nurse Practitioner

## 2021-03-11 ENCOUNTER — Other Ambulatory Visit (HOSPITAL_COMMUNITY)
Admission: RE | Admit: 2021-03-11 | Discharge: 2021-03-11 | Disposition: A | Discharge status: Home / Self Care | Payer: BC Managed Care – PPO | Source: Ambulatory Visit | Attending: Nurse Practitioner | Admitting: Nurse Practitioner

## 2021-03-11 ENCOUNTER — Encounter: Payer: Self-pay | Admitting: Nurse Practitioner

## 2021-03-11 ENCOUNTER — Other Ambulatory Visit: Payer: Self-pay

## 2021-03-11 ENCOUNTER — Ambulatory Visit (INDEPENDENT_AMBULATORY_CARE_PROVIDER_SITE_OTHER): Payer: BC Managed Care – PPO | Admitting: Nurse Practitioner

## 2021-03-11 VITALS — BP 120/70 | HR 57 | Temp 98.3°F | Ht 66.2 in | Wt 201.2 lb

## 2021-03-11 DIAGNOSIS — H6123 Impacted cerumen, bilateral: Secondary | ICD-10-CM | POA: Diagnosis not present

## 2021-03-11 DIAGNOSIS — Z113 Encounter for screening for infections with a predominantly sexual mode of transmission: Secondary | ICD-10-CM | POA: Insufficient documentation

## 2021-03-11 DIAGNOSIS — Z0000 Encounter for general adult medical examination without abnormal findings: Secondary | ICD-10-CM | POA: Diagnosis not present

## 2021-03-11 NOTE — Patient Instructions (Signed)

## 2021-03-11 NOTE — Progress Notes (Signed)
I,Tianna Badgett,acting as a Education administrator for [employer], NP.,have documented all relevant documentation on the behalf of [employer], NP,as directed by  Bary Castilla, NP while in the presence of Bary Castilla, NP.  This visit occurred during the SARS-CoV-2 public health emergency.  Safety protocols were in place, including screening questions prior to the visit, additional usage of staff PPE, and extensive cleaning of exam room while observing appropriate contact time as indicated for disinfecting solutions.  Subjective:     Patient ID: Jonathon Atkinson , male    DOB: 12-11-1985 , 35 y.o.   MRN: [id number]   Chief Complaint  Patient presents with  . Annual Exam    HPI  Patient is here for physical exam. He has no concerns at this time. He would like to STD testing at this time. 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 No other concerns at this time. He doesn't really eat healthy. He eats whatever. He works out daily. No other concerns were expressed today. He is taking no medications at this time.      Past Medical History:  Diagnosis Date  . Asthma      Family History  Problem Relation Age of Onset  . Heart failure Mother   . Diabetes Father     No current outpatient medications on file.   Allergies  Allergen Reactions  . Sulfa Antibiotics      Men's preventive visit. Patient Health Questionnaire (PHQ-2) is  [place] Office Visit from 03/11/2021 in [facility]  PHQ-2 Total Score 0    . Patient is not any diet. Marital status: Single. Relevant history for alcohol use is:  Social History   Substance and Sexual Activity  Alcohol Use No  . Relevant history for tobacco use is:  Social History   Tobacco Use  Smoking Status Never Smoker  Smokeless Tobacco Never Used  .   Review of Systems  Constitutional: Negative.  Negative for chills and fever.  HENT: Negative.  Negative for sinus pressure and tinnitus.   Eyes: Negative.   Negative for pain and visual disturbance.  Respiratory: Negative.  Negative for cough, shortness of breath and wheezing.   Cardiovascular: Negative.  Negative for chest pain and palpitations.  Gastrointestinal: Negative.  Negative for constipation, diarrhea and nausea.  Endocrine: Negative.   Genitourinary: Negative.   Musculoskeletal: Negative.   Skin: Negative.  Negative for rash.  Allergic/Immunologic: Negative.   Neurological: Negative.  Negative for numbness and headaches.  Hematological: Negative.   Psychiatric/Behavioral: Negative.      Today's Vitals   03/11/21 1411  BP: 120/70  Pulse: (!) 57  Temp: 98.3 F (36.8 C)  TempSrc: Oral  Weight: 201 lb 3.2 oz (91.3 kg)  Height: 5' 6.2" (1.681 m)   Body mass index is 32.28 kg/m.  Wt Readings from Last 3 Encounters:  03/11/21 201 lb 3.2 oz (91.3 kg)  03/05/20 197 lb (89.4 kg)  12/25/19 191 lb 12.8 oz (87 kg)    Objective:  Physical Exam Vitals and nursing note reviewed.  Constitutional:      Appearance: Normal appearance.  HENT:     Head: Normocephalic and atraumatic.     Right Ear: Tympanic membrane, ear canal and external ear normal. There is impacted cerumen.     Left Ear: Tympanic membrane, ear canal and external ear normal. There is impacted cerumen.     Nose: Nose normal.     Mouth/Throat:     Mouth: Mucous membranes are moist.  Pharynx: Oropharynx is clear.  Eyes:     Extraocular Movements: Extraocular movements intact.     Conjunctiva/sclera: Conjunctivae normal.     Pupils: Pupils are equal, round, and reactive to light.  Cardiovascular:     Rate and Rhythm: Normal rate and regular rhythm.     Pulses: Normal pulses.     Heart sounds: Normal heart sounds. No murmur heard.   Pulmonary:     Effort: Pulmonary effort is normal.     Breath sounds: Normal breath sounds.  Abdominal:     General: Abdomen is flat. Bowel sounds are normal.     Palpations: Abdomen is soft.  Genitourinary:    Comments:  deferred Musculoskeletal:        General: Normal range of motion.     Cervical back: Normal range of motion and neck supple.  Skin:    General: Skin is warm and dry.  Neurological:     General: No focal deficit present.     Mental Status: He is alert and oriented to person, place, and time.  Psychiatric:        Mood and Affect: Mood normal.        Behavior: Behavior normal.         Assessment And Plan:    1. Routine medical exam --Patient is here for their annual physical exam and we discussed any changes to medication and medical history.  -Behavior modification was discussed as well as diet and exercise history  -Patient will continue to exercise regularly and modify their diet.  -Recommendation for yearly physical annuals, immunization and screenings including mammogram and colonoscopy were discussed with the patient.  -Recommended intake of multivitamin, vitamin D and calcium.  -Individualized advise was given to the patient pertaining to their own health history in regards to diet, exercise, medical condition and referrals.  - CBC - Hemoglobin A1c - CMP14+EGFR - Lipid panel  2. Bilateral impacted cerumen -Curette used in both ears.   3. Screening for STD (sexually transmitted disease) - Urine cytology ancillary only - Hepatitis C antibody - HSV Type I/II IgG, IgMw/ reflex - RPR - HIV antibody (with reflex) - Hepatitis panel, acute  Staying healthy and adopting a healthy lifestyle for your overall health is important. You should eat 7 or more servings of fruits and vegetables per day. You should drink plenty of water to keep yourself hydrated and your kidneys healthy. This includes about 65-80+ fluid ounces of water. Limit your intake of animal fats especially for elevated cholesterol. Avoid highly processed food and limit your salt intake if you have hypertension. Avoid foods high in saturated/Trans fats. Along with a healthy diet it is also very important to maintain  time for yourself to maintain a healthy mental health with low stress levels. You should get atleast 150 min of moderate intensity exercise weekly for a healthy heart. Along with eating right and exercising, aim for at least 7-9 hours of sleep daily.  Eat more whole grains which includes barley, wheat berries, oats, brown rice and whole wheat pasta. Use healthy plant oils which include olive, soy, corn, sunflower and peanut. Limit your caffeine and sugary drinks. Limit your intake of fast foods. Limit milk and dairy products to one or two daily servings.   Patient was given opportunity to ask questions. Patient verbalized understanding of the plan and was able to repeat key elements of the plan. All questions were answered to their satisfaction.  Jonathon Sloane Palmer, DNP   I, Jonathon Atkinson  have reviewed all documentation for this visit. The documentation on 03/11/21 for the exam, diagnosis, procedures, and orders are all accurate and complete.    THE PATIENT IS ENCOURAGED TO PRACTICE SOCIAL DISTANCING DUE TO THE COVID-19 PANDEMIC.

## 2021-03-12 LAB — HEPATITIS B SURFACE ANTIBODY,QUALITATIVE: Hep B Surface Ab, Qual: REACTIVE

## 2021-03-12 LAB — HEPATITIS B SURFACE ANTIGEN: Hepatitis B Surface Ag: NEGATIVE

## 2021-03-15 LAB — URINE CYTOLOGY ANCILLARY ONLY
Bacterial Vaginitis-Urine: NEGATIVE
Candida Urine: NEGATIVE
Chlamydia: NEGATIVE
Comment: NEGATIVE
Comment: NEGATIVE
Comment: NORMAL
Neisseria Gonorrhea: NEGATIVE
Trichomonas: NEGATIVE

## 2021-03-16 LAB — CMP14+EGFR
ALT: 40 IU/L (ref 0–44)
AST: 22 IU/L (ref 0–40)
Albumin/Globulin Ratio: 1.5 (ref 1.2–2.2)
Albumin: 4.3 g/dL (ref 4.0–5.0)
Alkaline Phosphatase: 63 IU/L (ref 44–121)
BUN/Creatinine Ratio: 10 (ref 9–20)
BUN: 11 mg/dL (ref 6–20)
Bilirubin Total: 0.2 mg/dL (ref 0.0–1.2)
CO2: 22 mmol/L (ref 20–29)
Calcium: 9.3 mg/dL (ref 8.7–10.2)
Chloride: 102 mmol/L (ref 96–106)
Creatinine, Ser: 1.13 mg/dL (ref 0.76–1.27)
Globulin, Total: 2.8 g/dL (ref 1.5–4.5)
Glucose: 89 mg/dL (ref 65–99)
Potassium: 4.1 mmol/L (ref 3.5–5.2)
Sodium: 139 mmol/L (ref 134–144)
Total Protein: 7.1 g/dL (ref 6.0–8.5)
eGFR: 87 mL/min/{1.73_m2} (ref >59)

## 2021-03-16 LAB — CBC
Hematocrit: 46 % (ref 37.5–51.0)
Hemoglobin: 15.3 g/dL (ref 13.0–17.7)
MCH: 30.4 pg (ref 26.6–33.0)
MCHC: 33.3 g/dL (ref 31.5–35.7)
MCV: 92 fL (ref 79–97)
Platelets: 234 10*3/uL (ref 150–450)
RBC: 5.03 x10E6/uL (ref 4.14–5.80)
RDW: 12.5 % (ref 11.6–15.4)
WBC: 5.3 10*3/uL (ref 3.4–10.8)

## 2021-03-16 LAB — LIPID PANEL
Chol/HDL Ratio: 2.6 ratio (ref 0.0–5.0)
Cholesterol, Total: 130 mg/dL (ref 100–199)
HDL: 50 mg/dL (ref >39)
LDL Chol Calc (NIH): 67 mg/dL (ref 0–99)
Triglycerides: 64 mg/dL (ref 0–149)
VLDL Cholesterol Cal: 13 mg/dL (ref 5–40)

## 2021-03-16 LAB — HEPATITIS C ANTIBODY: Hep C Virus Ab: <0.1 s/co ratio (ref 0.0–0.9)

## 2021-03-16 LAB — HIV ANTIBODY (ROUTINE TESTING W REFLEX): HIV Screen 4th Generation wRfx: NONREACTIVE

## 2021-03-16 LAB — RPR: RPR Ser Ql: NONREACTIVE

## 2021-03-16 LAB — HEMOGLOBIN A1C
Est. average glucose Bld gHb Est-mCnc: 120 mg/dL
Hgb A1c MFr Bld: 5.8 % — ABNORMAL HIGH (ref 4.8–5.6)

## 2022-03-17 ENCOUNTER — Encounter: Payer: BC Managed Care – PPO | Admitting: Internal Medicine

## 2023-03-18 ENCOUNTER — Ambulatory Visit
Admission: EM | Admit: 2023-03-18 | Discharge: 2023-03-18 | Disposition: A | Discharge status: Home / Self Care | Payer: BC Managed Care – PPO | Attending: Emergency Medicine | Admitting: Emergency Medicine

## 2023-03-18 ENCOUNTER — Encounter: Payer: Self-pay | Admitting: Emergency Medicine

## 2023-03-18 DIAGNOSIS — R051 Acute cough: Secondary | ICD-10-CM

## 2023-03-18 DIAGNOSIS — J302 Other seasonal allergic rhinitis: Secondary | ICD-10-CM | POA: Insufficient documentation

## 2023-03-18 DIAGNOSIS — Z113 Encounter for screening for infections with a predominantly sexual mode of transmission: Secondary | ICD-10-CM | POA: Diagnosis not present

## 2023-03-18 MED ORDER — BENZONATATE 100 MG PO CAPS: 100.0000 mg | ORAL_CAPSULE | Freq: Three times a day (TID) | ORAL | 0 refills | Status: AC | PRN

## 2023-03-18 NOTE — ED Triage Notes (Signed)
Patient in office today c/o STD check no sx and and also a cough that has been ongoing on and off x 1 1/2 wk ago.  OTC: Mucinex and sudafed  Denied: Fever, n/v

## 2023-03-18 NOTE — ED Provider Notes (Addendum)
[facility]    CSN: [id number] Arrival date & time: 03/18/23  1030      History   Chief Complaint Chief Complaint  Patient presents with   SEXUALLY TRANSMITTED DISEASE   Cough    HPI Jonathon Atkinson is a 37 y.o. male presenting to urgent care this morning for STD testing and cough for a week and a half.  Patient reports being sexually active and uses protection.  He just wants to know his status.  He reports no dysuria, penile discharge, rash, fever, chills, back pain, nausea/vomiting, or any other symptom.  Patient reports intermittent dry cough that started about a week and a half ago. He is taking Mucinex OTC.  He has history of asthma as a child, no pulmonology recent visit.  He is not taking any asthma medication currently. He reports no CP, shortness of breath, dizziness or chills today.  The history is provided by the patient.  Cough Cough characteristics:  Dry Severity:  Mild Duration:  13 days Timing:  Intermittent Progression:  Unable to specify Chronicity:  New Smoker: no   Associated symptoms: no chest pain, no chills, no fever, no headaches, no shortness of breath and no wheezing     Past Medical History:  Diagnosis Date   Asthma     Patient Active Problem List   Diagnosis Date Noted   History of CVA (cerebrovascular accident) without residual deficits 11/15/2018   Excessive cerumen in both ear canals 11/15/2018   Flat feet, bilateral 11/15/2018   Mild intermittent asthma without complication 11/15/2018   Chronic pruritic rash in adult 11/01/2014    Past Surgical History:  Procedure Laterality Date   LUNG SURGERY        Home Medications    Prior to Admission medications   Not on File    Family History Family History  Problem Relation Age of Onset   Heart failure Mother    Diabetes Father     Social History Social History   Tobacco Use   Smoking status: Never   Smokeless tobacco: Never  Vaping Use   Vaping Use: Never used   Substance Use Topics   Alcohol use: No   Drug use: Never     Allergies   Sulfa antibiotics   Review of Systems Review of Systems  Constitutional:  Negative for activity change, chills and fever.  HENT:  Negative for congestion.   Respiratory:  Positive for cough. Negative for apnea, chest tightness, shortness of breath and wheezing.   Cardiovascular:  Negative for chest pain and palpitations.  Gastrointestinal:  Negative for abdominal pain, nausea and vomiting.  Genitourinary:  Negative for dysuria, flank pain, hematuria, penile discharge, penile pain, penile swelling and urgency.  Neurological:  Negative for dizziness and headaches.  Psychiatric/Behavioral:  Negative for agitation.     Physical Exam Triage Vital Signs ED Triage Vitals  Enc Vitals Group     BP 03/18/23 1149 111/64     Pulse Rate 03/18/23 1149 (!) 53     Resp 03/18/23 1149 16     Temp 03/18/23 1149 98.2 F (36.8 C)     Temp Source 03/18/23 1149 Oral     SpO2 03/18/23 1149 95 %     Weight 03/18/23 1153 200 lb (90.7 kg)     Height 03/18/23 1153 5\' 8"  (1.727 m)     Head Circumference --      Peak Flow --      Pain Score --  Pain Loc --      Pain Edu? --      Excl. in GC? --    No data found.  Updated Vital Signs BP 111/64 (BP Location: Left Arm)   Pulse (!) 53   Temp 98.2 F (36.8 C) (Oral)   Resp 16   Ht 5\' 8"  (1.727 m)   Wt 200 lb (90.7 kg)   SpO2 95%   BMI 30.41 kg/m   Visual Acuity Right Eye Distance:   Left Eye Distance:   Bilateral Distance:    Right Eye Near:   Left Eye Near:    Bilateral Near:     Physical Exam Constitutional:      General: He is not in acute distress.    Appearance: Normal appearance. He is not ill-appearing.  HENT:     Head: Normocephalic and atraumatic.     Right Ear: Tympanic membrane normal.     Left Ear: Tympanic membrane normal.     Nose: No congestion or rhinorrhea.     Mouth/Throat:     Mouth: Mucous membranes are moist.     Pharynx:  Oropharynx is clear.  Cardiovascular:     Rate and Rhythm: Normal rate and regular rhythm.     Heart sounds: Normal heart sounds.  Pulmonary:     Effort: Pulmonary effort is normal. No respiratory distress.     Breath sounds: Normal breath sounds. No wheezing or rales.  Chest:     Chest wall: No tenderness.  Abdominal:     General: Abdomen is flat. Bowel sounds are normal.     Palpations: Abdomen is soft.     Tenderness: There is no abdominal tenderness.  Genitourinary:    Comments: Deferred, no symptoms. Skin:    General: Skin is warm and dry.  Neurological:     General: No focal deficit present.     Mental Status: He is alert and oriented to person, place, and time.  Psychiatric:        Mood and Affect: Mood normal.        Behavior: Behavior normal.      UC Treatments / Results  Labs (all labs ordered are listed, but only abnormal results are displayed) Labs Reviewed  CYTOLOGY, (ORAL, ANAL, URETHRAL) ANCILLARY ONLY    EKG   Radiology No results found.  Procedures Procedures (including critical care time)  Medications Ordered in UC Medications - No data to display  Initial Impression / Assessment and Plan / UC Course  I have reviewed the triage vital signs and the nursing notes.  Pertinent labs & imaging results that were available during my care of the patient were reviewed by me and considered in my medical decision making (see chart for details).   Patient is here for STD testing, he is asymptomatic. Patient uses protection during sexual intercourse.  He has acute cough due to seasonal allergies.  Has history of asthma as a child, not currently on treatments.  Patient prescribed benzonatate 100 mg p.o. every 8 hours as needed for cough.  Lab work done for STD.  Patient reminded to report new or worsening symptoms to emergency room and follow-up with primary care provider as needed.  All his questions were answered. Final Clinical Impressions(s) / UC Diagnoses    Final diagnoses:  Screening for STD (sexually transmitted disease)   Discharge Instructions   None    ED Prescriptions   None    PDMP not reviewed this encounter.   Eleonore Chiquito, FNP  03/18/23 1235    Eleonore Chiquito, FNP 03/18/23 1235

## 2023-03-18 NOTE — Discharge Instructions (Addendum)
Your STD testing is pending, if anything positive we will call you.  Please follow-up with your primary care provider as needed.  Take benzonatate every 8 hours as needed for cough.  Report new or worsening symptoms to the emergency room.

## 2023-03-19 LAB — RPR: RPR Ser Ql: NONREACTIVE

## 2023-03-20 LAB — HIV ANTIBODY (ROUTINE TESTING W REFLEX): HIV Screen 4th Generation wRfx: NONREACTIVE

## 2023-03-21 LAB — CYTOLOGY, (ORAL, ANAL, URETHRAL) ANCILLARY ONLY
Chlamydia: NEGATIVE
Comment: NEGATIVE
Comment: NEGATIVE
Comment: NORMAL
Neisseria Gonorrhea: NEGATIVE
Trichomonas: NEGATIVE
